# Patient Record
Sex: Female | Born: 1970 | Race: Black or African American | Hispanic: No | Marital: Married | Smoking: Never smoker
Health system: Southern US, Community
[De-identification: ages and names within clinical notes are randomized; demographics above are authoritative.]

## PROBLEM LIST (undated history)

## (undated) DIAGNOSIS — E669 Obesity, unspecified: Secondary | ICD-10-CM

## (undated) DIAGNOSIS — D259 Leiomyoma of uterus, unspecified: Secondary | ICD-10-CM

## (undated) DIAGNOSIS — R51 Headache: Secondary | ICD-10-CM

## (undated) DIAGNOSIS — R519 Headache, unspecified: Secondary | ICD-10-CM

## (undated) HISTORY — DX: Leiomyoma of uterus, unspecified: D25.9

## (undated) HISTORY — DX: Headache, unspecified: R51.9

## (undated) HISTORY — PX: WISDOM TOOTH EXTRACTION: SHX21

## (undated) HISTORY — DX: Obesity, unspecified: E66.9

## (undated) HISTORY — DX: Headache: R51

---

## 1997-07-02 ENCOUNTER — Other Ambulatory Visit: Admission: RE | Admit: 1997-07-02 | Discharge: 1997-07-02 | Payer: Self-pay | Admitting: Obstetrics and Gynecology

## 1998-10-20 ENCOUNTER — Inpatient Hospital Stay (HOSPITAL_COMMUNITY): Admission: AD | Admit: 1998-10-20 | Discharge: 1998-10-22 | Payer: Self-pay | Admitting: Obstetrics and Gynecology

## 1998-10-24 ENCOUNTER — Encounter (HOSPITAL_COMMUNITY): Admission: RE | Admit: 1998-10-24 | Discharge: 1999-01-22 | Payer: Self-pay | Admitting: Obstetrics and Gynecology

## 1998-11-19 ENCOUNTER — Other Ambulatory Visit: Admission: RE | Admit: 1998-11-19 | Discharge: 1998-11-19 | Payer: Self-pay | Admitting: Obstetrics and Gynecology

## 2000-01-02 ENCOUNTER — Other Ambulatory Visit: Admission: RE | Admit: 2000-01-02 | Discharge: 2000-01-02 | Payer: Self-pay | Admitting: Obstetrics and Gynecology

## 2001-01-31 ENCOUNTER — Other Ambulatory Visit: Admission: RE | Admit: 2001-01-31 | Discharge: 2001-01-31 | Payer: Self-pay | Admitting: Obstetrics and Gynecology

## 2002-05-07 ENCOUNTER — Observation Stay (HOSPITAL_COMMUNITY): Admission: AD | Admit: 2002-05-07 | Discharge: 2002-05-07 | Payer: Self-pay | Admitting: Obstetrics and Gynecology

## 2002-05-07 ENCOUNTER — Encounter: Payer: Self-pay | Admitting: Obstetrics and Gynecology

## 2002-05-12 ENCOUNTER — Encounter (HOSPITAL_COMMUNITY): Admission: RE | Admit: 2002-05-12 | Discharge: 2002-06-11 | Payer: Self-pay | Admitting: Obstetrics and Gynecology

## 2002-05-15 ENCOUNTER — Encounter: Payer: Self-pay | Admitting: Obstetrics and Gynecology

## 2002-05-22 ENCOUNTER — Encounter: Payer: Self-pay | Admitting: Obstetrics and Gynecology

## 2002-05-29 ENCOUNTER — Encounter: Payer: Self-pay | Admitting: Obstetrics and Gynecology

## 2002-06-05 ENCOUNTER — Encounter: Payer: Self-pay | Admitting: Obstetrics and Gynecology

## 2002-06-12 ENCOUNTER — Ambulatory Visit (HOSPITAL_COMMUNITY): Admission: RE | Admit: 2002-06-12 | Discharge: 2002-06-12 | Payer: Self-pay | Admitting: Obstetrics and Gynecology

## 2002-06-12 ENCOUNTER — Encounter: Payer: Self-pay | Admitting: Obstetrics and Gynecology

## 2002-06-16 ENCOUNTER — Encounter: Admission: RE | Admit: 2002-06-16 | Discharge: 2002-06-16 | Payer: Self-pay | Admitting: Obstetrics and Gynecology

## 2002-06-19 ENCOUNTER — Encounter: Admission: RE | Admit: 2002-06-19 | Discharge: 2002-06-19 | Payer: Self-pay | Admitting: Obstetrics and Gynecology

## 2002-06-19 ENCOUNTER — Ambulatory Visit (HOSPITAL_COMMUNITY): Admission: RE | Admit: 2002-06-19 | Discharge: 2002-06-19 | Payer: Self-pay | Admitting: Obstetrics and Gynecology

## 2002-06-19 ENCOUNTER — Encounter: Payer: Self-pay | Admitting: Obstetrics and Gynecology

## 2002-06-21 ENCOUNTER — Inpatient Hospital Stay (HOSPITAL_COMMUNITY): Admission: AD | Admit: 2002-06-21 | Discharge: 2002-06-26 | Payer: Self-pay | Admitting: Obstetrics and Gynecology

## 2002-06-21 ENCOUNTER — Encounter (INDEPENDENT_AMBULATORY_CARE_PROVIDER_SITE_OTHER): Payer: Self-pay | Admitting: Specialist

## 2002-09-18 ENCOUNTER — Encounter: Admission: RE | Admit: 2002-09-18 | Discharge: 2002-10-18 | Payer: Self-pay | Admitting: Obstetrics and Gynecology

## 2002-12-16 ENCOUNTER — Other Ambulatory Visit: Admission: RE | Admit: 2002-12-16 | Discharge: 2002-12-16 | Payer: Self-pay | Admitting: Obstetrics and Gynecology

## 2003-12-18 ENCOUNTER — Ambulatory Visit: Payer: Self-pay | Admitting: Family Medicine

## 2004-02-11 ENCOUNTER — Other Ambulatory Visit: Admission: RE | Admit: 2004-02-11 | Discharge: 2004-02-11 | Payer: Self-pay | Admitting: Obstetrics and Gynecology

## 2005-05-22 ENCOUNTER — Ambulatory Visit: Payer: Self-pay | Admitting: Family Medicine

## 2005-07-21 ENCOUNTER — Ambulatory Visit: Payer: Self-pay | Admitting: Family Medicine

## 2006-07-18 ENCOUNTER — Ambulatory Visit: Payer: Self-pay | Admitting: Family Medicine

## 2006-08-08 ENCOUNTER — Telehealth: Payer: Self-pay | Admitting: Family Medicine

## 2006-09-07 ENCOUNTER — Ambulatory Visit: Payer: Self-pay

## 2006-12-06 ENCOUNTER — Ambulatory Visit: Payer: Self-pay | Admitting: Internal Medicine

## 2006-12-06 DIAGNOSIS — E782 Mixed hyperlipidemia: Secondary | ICD-10-CM

## 2006-12-06 DIAGNOSIS — R002 Palpitations: Secondary | ICD-10-CM | POA: Insufficient documentation

## 2006-12-10 ENCOUNTER — Telehealth (INDEPENDENT_AMBULATORY_CARE_PROVIDER_SITE_OTHER): Payer: Self-pay | Admitting: *Deleted

## 2006-12-11 ENCOUNTER — Encounter: Payer: Self-pay | Admitting: Internal Medicine

## 2007-01-22 ENCOUNTER — Ambulatory Visit: Payer: Self-pay | Admitting: Cardiology

## 2007-02-04 ENCOUNTER — Ambulatory Visit: Payer: Self-pay

## 2007-02-04 ENCOUNTER — Encounter: Payer: Self-pay | Admitting: Cardiology

## 2007-02-04 ENCOUNTER — Ambulatory Visit: Payer: Self-pay | Admitting: Cardiology

## 2007-10-23 ENCOUNTER — Ambulatory Visit: Payer: Self-pay | Admitting: Internal Medicine

## 2007-10-23 DIAGNOSIS — K449 Diaphragmatic hernia without obstruction or gangrene: Secondary | ICD-10-CM | POA: Insufficient documentation

## 2007-11-06 ENCOUNTER — Telehealth (INDEPENDENT_AMBULATORY_CARE_PROVIDER_SITE_OTHER): Payer: Self-pay | Admitting: *Deleted

## 2008-10-28 ENCOUNTER — Telehealth (INDEPENDENT_AMBULATORY_CARE_PROVIDER_SITE_OTHER): Payer: Self-pay | Admitting: *Deleted

## 2008-11-03 ENCOUNTER — Ambulatory Visit: Payer: Self-pay | Admitting: Internal Medicine

## 2008-11-03 LAB — CONVERTED CEMR LAB
Albumin: 3.8 g/dL (ref 3.5–5.2)
BUN: 10 mg/dL (ref 6–23)
Basophils Relative: 0.7 % (ref 0.0–3.0)
Bilirubin, Direct: 0 mg/dL (ref 0.0–0.3)
Chloride: 104 meq/L (ref 96–112)
Cholesterol: 173 mg/dL (ref 0–200)
Eosinophils Relative: 0.8 % (ref 0.0–5.0)
Glucose, Bld: 86 mg/dL (ref 70–99)
Hemoglobin: 13 g/dL (ref 12.0–15.0)
LDL Cholesterol: 123 mg/dL — ABNORMAL HIGH (ref 0–99)
Lymphocytes Relative: 27.2 % (ref 12.0–46.0)
Monocytes Relative: 4.8 % (ref 3.0–12.0)
Neutro Abs: 3.1 10*3/uL (ref 1.4–7.7)
Potassium: 4.1 meq/L (ref 3.5–5.1)
RBC: 4.39 M/uL (ref 3.87–5.11)
Total Protein: 7.6 g/dL (ref 6.0–8.3)
Triglycerides: 39 mg/dL (ref 0.0–149.0)
VLDL: 7.8 mg/dL (ref 0.0–40.0)

## 2008-11-12 ENCOUNTER — Ambulatory Visit: Payer: Self-pay | Admitting: Internal Medicine

## 2009-06-03 ENCOUNTER — Ambulatory Visit: Payer: Self-pay | Admitting: Internal Medicine

## 2009-06-03 ENCOUNTER — Telehealth (INDEPENDENT_AMBULATORY_CARE_PROVIDER_SITE_OTHER): Payer: Self-pay | Admitting: *Deleted

## 2009-06-03 DIAGNOSIS — Z8679 Personal history of other diseases of the circulatory system: Secondary | ICD-10-CM | POA: Insufficient documentation

## 2009-06-03 DIAGNOSIS — R030 Elevated blood-pressure reading, without diagnosis of hypertension: Secondary | ICD-10-CM

## 2009-06-03 DIAGNOSIS — R51 Headache: Secondary | ICD-10-CM

## 2009-06-03 DIAGNOSIS — R519 Headache, unspecified: Secondary | ICD-10-CM | POA: Insufficient documentation

## 2010-03-13 LAB — CONVERTED CEMR LAB
ALT: 14 units/L (ref 0–40)
AST: 16 units/L (ref 0–37)
Alkaline Phosphatase: 86 units/L (ref 39–117)
BUN: 11 mg/dL (ref 6–23)
Basophils Relative: 0.2 % (ref 0.0–1.0)
CO2: 29 meq/L (ref 19–32)
Chloride: 106 meq/L (ref 96–112)
Creatinine, Ser: 0.9 mg/dL (ref 0.4–1.2)
Eosinophils Absolute: 0.1 10*3/uL (ref 0.0–0.7)
Eosinophils Relative: 1 % (ref 0–5)
HCT: 38.9 % (ref 36.0–46.0)
HCT: 39.8 % (ref 36.0–46.0)
HDL: 39.9 mg/dL (ref >39.0)
Hemoglobin: 13.4 g/dL (ref 12.0–15.0)
Hgb A1c MFr Bld: 5.2 % (ref 4.6–6.0)
LDL Cholesterol: 147 mg/dL — ABNORMAL HIGH (ref 0–99)
Lymphs Abs: 1.7 10*3/uL (ref 0.7–4.0)
MCV: 91.1 fL (ref 78.0–100.0)
Magnesium: 2 mg/dL (ref 1.5–2.5)
Monocytes Absolute: 0.3 10*3/uL (ref 0.2–0.7)
Monocytes Relative: 5.3 % (ref 3.0–11.0)
Platelets: 237 10*3/uL (ref 150–400)
Potassium: 4.1 meq/L (ref 3.5–5.1)
RDW: 12 % (ref 11.5–14.6)
RDW: 12.8 % (ref 11.5–15.5)
TSH: 0.84 microintl units/mL (ref 0.35–5.50)
Total Bilirubin: 0.8 mg/dL (ref 0.3–1.2)
Total Protein: 7.1 g/dL (ref 6.0–8.3)
VLDL: 8 mg/dL (ref 0–40)
WBC: 5.4 10*3/uL (ref 4.0–10.5)

## 2010-03-15 NOTE — Progress Notes (Signed)
Summary: elevated BP  Phone Note Call from Patient   Caller: Patient Summary of Call: pt left VM that BP was elevated to at 141/83. pt states that while exercising today her heart began to race so she stopped exercising. pt states that she is unsure if BP reading is normal or what it is suppose to be. pt would like a call back to discuss BP reading. Tried to call pt wireless customer unavailable...............Marland KitchenFelecia Deloach CMA  June 03, 2009 11:40 AM   Follow-up for Phone Call        pt c/o sob, and heart racing while exercising.  pt currently being wean off weight loss med PHENTERMINE. pt denies any chest pain, numbness, tingling, dizziness. pt coming in for OV today..............Marland KitchenFelecia Deloach CMA  June 03, 2009 11:53 AM

## 2010-03-15 NOTE — Assessment & Plan Note (Signed)
Summary: elevated BP//fd   Vital Signs:  Patient profile:   40 year old female Weight:      236.4 pounds Temp:     99.1 degrees F oral Pulse rate:   72 / minute Resp:     15 per minute BP sitting:   138 / 88  (left arm) Cuff size:   large  Vitals Entered By: Shonna Chock (June 03, 2009 2:42 PM) CC: Elevated B/P , Hypertension Management Comments REVIEWED MED LIST, PATIENT AGREED DOSE AND INSTRUCTION CORRECT    Primary Care Provider:  Laury Axon  CC:  Elevated B/P  and Hypertension Management.  History of Present Illness: Today she had tachycardia after 2 minutes on elliptical & then 5 min later after  1 min on treadmill.She had been exercising for 3 months w/o rate changes.Her BP was 141/83; it has been "normal" in past. She is on Phentermine every other day from Dr Dareen Piano. She also has  had increased caffeine intake  this week.  Hypertension History:      She complains of headache and palpitations, but denies chest pain, dyspnea with exertion, orthopnea, PND, peripheral edema, visual symptoms, neurologic problems, and syncope.        Positive major cardiovascular risk factors include hyperlipidemia and family history for ischemic heart disease (males less than 30 years old).  Negative major cardiovascular risk factors include female age less than 68 years old and non-tobacco-user status.     Allergies: 1)  ! Pcn  Review of Systems Eyes:  Denies blurring, double vision, and vision loss-both eyes. CV:  Complains of leg cramps with exertion; denies lightheadness and near fainting; Cramps with walking but not with CVE. Neuro:  Complains of headaches; denies disturbances in coordination, numbness, poor balance, and tingling; "Couple " headaches over crown, ? throbbing, lasting 1 hr, better with sinus medication. No PMH of migraine. trigger= "wind blowing". Allergy:  Denies itching eyes and sneezing.  Physical Exam  General:  well-nourished,in no acute distress; alert,appropriate  and cooperative throughout examination Neck:  No deformities, masses, or tenderness noted. Lungs:  Normal respiratory effort, chest expands symmetrically. Lungs are clear to auscultation, no crackles or wheezes. Heart:  Normal rate and regular rhythm. S1 and S2 normal without gallop, murmur, click, rub . S4 Abdomen:  Bowel sounds positive,abdomen soft and non-tender without masses, organomegaly or hernias noted. No AAA Pulses:  R and L carotid,radial,dorsalis pedis and posterior tibial pulses are full and equal bilaterally Extremities:  No clubbing, cyanosis, edema. Neg Homan's Neurologic:  alert & oriented X3 and DTRs symmetrical and normal.   Skin:  Intact without suspicious lesions or rashes. Striae  Psych:  memory intact for recent and remote, normally interactive, and good eye contact.     Impression & Recommendations:  Problem # 1:  ELEVATED BLOOD PRESSURE WITHOUT DIAGNOSIS OF HYPERTENSION (ICD-796.2)  Orders: EKG w/ Interpretation (93000) Echo- Stress (Stress Echo)  Problem # 2:  HEADACHE (ICD-784.0)  Orders: EKG w/ Interpretation (93000)  Problem # 3:  TACHYCARDIA, HX OF (ICD-V12.50)  Complete Medication List: 1)  Phentermine Hcl 37.5 Mg Caps (Phentermine hcl) .Marland Kitchen.. 1 by mouth every other day  Hypertension Assessment/Plan:      The patient's hypertensive risk group is category B: At least one risk factor (excluding diabetes) with no target organ damage.  Her calculated 10 year risk of coronary heart disease is 1 %.  Today's blood pressure is 138/88.    Patient Instructions: 1)  Check your Blood Pressure regularly. If it  is above:135/85 ON AVERAGE  you should make an appointment. No CVE until Stress ECHO is completed. Discuss risks & benefits of  continuing  Phentermine with Dr Dareen Piano.

## 2010-04-20 ENCOUNTER — Other Ambulatory Visit: Payer: Self-pay | Admitting: Obstetrics and Gynecology

## 2010-06-28 NOTE — Procedures (Signed)
[facility]                              EXERCISE TREADMILL   Helen Miles, Helen Miles                  MRN:          [id number]  DATE:02/04/2007                            DOB:          03-01-1970    PRIMARY CARE PHYSICIAN:  Titus Dubin. Alwyn Ren, MD.   PROCEDURE:  Exercise treadmill test.   INDICATION:  Evaluate patient with chest discomfort and exercise-induced  tachycardia.   PROCEDURE NOTE:  The patient was exercised using the standard Bruce  protocol.  She was exercised for 8 minutes.  This completed 2 minutes in  stage 3.  The test was terminated because she achieved the target heart  rate and because of fatigue.  She achieved 10.1 mets.  Her maximum heart  rate was 171, which was 92% of predicted.  She had an appropriate though  somewhat blunted blood pressure response.  The maximum was 123/87.  She  had premature ventricular contractions.  These were during exertion and  actually went away with peak exercise.  There were no ischemic ST-T wave  changes.  She had a normal heart rate recovery.  She had no chest pain.  She had appropriate shortness of breath.   CONCLUSION:  Negative adequate exercise treadmill test.   PLAN:  Based on the above, the patient has no high risk features for  obstructive coronary disease.  She had a moderate exercise tolerance.  I  gave her a prescription for exercise.  I did appreciate the premature  ventricular contractions, which have been bothering her, but do not  suggest any further cardiovascular testing.     Rollene Rotunda, MD, [facility]  Electronically Signed    JH/MedQ  DD: 02/04/2007  DT: 02/04/2007  Job #: [id number]   cc:   Titus Dubin. Alwyn Ren, MD,FACP,FCCP

## 2010-06-28 NOTE — Assessment & Plan Note (Signed)
[facility]                            CARDIOLOGY OFFICE NOTE   NOHA, MILBERGER                  MRN:          [id number]  DATE:01/22/2007                            DOB:          01-Nov-1970    REFERRING PHYSICIAN:  Titus Dubin. Alwyn Ren, MD,FACP,FCCP   REASON FOR CONSULTATION:  Evaluate patient's palpitations.   HISTORY OF PRESENT ILLNESS:  The patient has a very pleasant, 40-year-  old African-American female with palpitations for the past 1-1/2 years.  She develops these sporadically.  She mostly notices them when she is  quiet or lying down at night.  When she lays down she feels her heart  going fast.  She does not describe it as irregular.  She did notice  recently, that she was able to bring on a very rapid heart rate with  minimal exertion on a treadmill; this worried her.  She did wear an  event monitor for one month, but apparently had no palpitations during  that time, and it did not capture any dysrhythmia.  She has had no other  cardiac workup, other than labs, which are pending for TSH and  magnesium.  She has never had any presyncope or syncope.  She denies any  chest discomfort, neck, or arm discomfort.  She has no shortness of  breath, PND, or orthopnea.   PAST MEDICAL HISTORY:  She has no history of hypertension, diabetes, or  hyperlipidemia.   PAST SURGICAL HISTORY:  C-section.   ALLERGIES:  PENICILLIN.   CURRENT MEDICATIONS:  None.   SOCIAL HISTORY:  The patient is married.  She has four young children.  She is a Psychologist, prison and probation services.  She has never smoked cigarettes and does  not drink alcohol.   FAMILY HISTORY:  Contributory for her father having a myocardial  infarction at age 61.  There is no history of sudden death, syncope,  heart failure.   REVIEW OF SYSTEMS:  Positive for occasional headaches.  Negative for  other systems.   PHYSICAL EXAMINATION:  The patient is well-appearing, and in no  distress.  Blood  pressure 114/80, heart rate 66 and regular, weight 264 pounds,  body mass index 48.  HEENT:  Eyes unremarkable, pupils equal, round and react to light.  Fundi not visualized.  Oral mucosa, unremarkable.  NECK:  No jugular venous distention at 45 degrees.  Carotid upstrokes  brisk and symmetric.  No bruits, no thyromegaly.  LYMPHATIC:  No cervical, axillary, or inguinal adenopathy.  LUNGS:  Clear to auscultation bilaterally.  BACK:  No cva tenderness.  CHEST:  Unremarkable.  HEART:  PMI displaced and sustained.  S1 and S2 within normal limits.  No S3, no S4, no clicks, rubs, or murmurs.  ABDOMEN:  Obese, positive bowel sounds, normal frequency, pitch.  No  bruits, no rebound, no guarding in the midline.pulsatileetile  tenderness,  no splenomegaly.  SKIN:  No rashes, no nodules.  EXTREMITIES:  2+ pulses throughout.  No edema, no cyanosis, no clubbing.  NEUROLOGIC:  Oriented to person, place, and time.  Cranial nerves II-XII  grossly intact.  Motor grossly intact.   EKG sinus  rhythm, rate 66, axis within normal limits, early transition  in lead V2, no acute ST-T wave changes.   ASSESSMENT AND PLAN:  1. Palpitations.  The patient has palpitations, that sound more like a      sustained tachyarrhythmia rather than isolated ectopic beats.  A 1      month, the event monitor was not helpful in capturing these, so I      will not reapply this.  Labs are pending.  She did have the recent      onset of symptoms while walking on a treadmill, so I will order an      POET (plain old exercise treadmill).  At the same time, when she      has this, I will order an echocardiogram to make sure that she has      a structurally normal heart.  Further evaluation based on future      symptoms and these studies.  2. Morbid obesity.  The patient has morbid obesity and understands the      importance to lose weight.  I have discussed with her the [employer].   FOLLOWUP:  I will see her back at  the time of her stress test.     Rollene Rotunda, MD, [facility]  Electronically Signed    JH/MedQ  DD: 01/22/2007  DT: 01/22/2007  Job #: [id number]   cc:   Titus Dubin. Alwyn Ren, MD,FACP,FCCP

## 2010-07-01 NOTE — Op Note (Signed)
NAMESHANEE, Helen Miles                     ACCOUNT NO.:  [id number]   MEDICAL RECORD NO.:  [id number]                   PATIENT TYPE:  INP   LOCATION:  9142                                 FACILITY:  [facility]   PHYSICIAN:  Malva Limes, M.D.                 DATE OF BIRTH:  Feb 25, 1970   DATE OF PROCEDURE:  06/21/2002  DATE OF DISCHARGE:                                 OPERATIVE REPORT   PREOPERATIVE DIAGNOSES:  1. Intrauterine twin gestation at 36-1/2 weeks estimated gestational age.  2. Active labor.  3. History of prior cesarean section with attempt at vaginal birth after     cesarean section.  4. Intrauterine growth retardation in twin A.   POSTOPERATIVE DIAGNOSES:  1. Intrauterine twin gestation at 36-1/2 weeks estimated gestational age.  2. Active labor.  3. History of prior cesarean section with attempt at vaginal birth after     cesarean section.  4. Intrauterine growth retardation in twin A.   PROCEDURE:  Repeat low transverse cesarean section.   SURGEON:  Malva Limes, M.D.   ANESTHESIA:  Spinal.   ANTIBIOTICS:  Ancef 1 gm.   DRAINS:  Foley catheter bedside drainage.   ESTIMATED BLOOD LOSS:  900 cc.   SPECIMENS:  Placentas sent to pathology.   COMPLICATIONS:  None.   FINDINGS:  The patient had a normal appearing uterus. Fallopian tubes and  ovaries were normal bilaterally. The patient delivered twin A in the vertex  presentation which was a viable black female infant. Twin B was delivered in  the breech presentation, but also was a viable black female.   DESCRIPTION OF PROCEDURE:  The patient was taken to the operating room where  spinal anesthesia was administered without complication. She was then placed  in the dorsal lithotomy position with a left lateral tilt. She was prepped  with Hibiclens and draped in the usual fashion for this procedure. A Foley  catheter had previously been placed.   A Pfannenstiel incision was made through the previous scar. This  was carried  down to the fascia. The fascia was entered in the midline and extended  laterally with the Mayo scissors. The rectus muscles were then dissected  from the fascia with the bovie. The rectus muscles were divided in the  midline and taken superiorly and inferiorly.   The parietal peritoneum was entered bluntly. The bladder flap was taken down  sharply. A low transverse uterine incision was made in the midline and  extended  laterally with dissection. The amniotic sac was entered sharply  slightly stained meconium fluid was noted in twin A's sac. Twin A was  delivered in the vertex presentation. The cord was doubly clamped and cut  and the infant handed to the waiting Neonatal Intensive Care Unit team.   Twin B's sac was ruptured and the fluid was found to be clear. The infant  was delivered in the breech presentation. On delivery of  the head the  oropharynx and nostrils were bulb suctioned. The cord was doubly clamped and  cut and handed to the waiting NICU team.   Cord blood was then taken from both cords. The placenta was then manually  removed. The uterine cavity was wiped with a wet lap. The uterine incision  was closed in a single layer with 0 chromics in a running locking fashion.  The bladder flap was not closed. Hemostasis was checked and felt to be  adequate.   The fallopian tubes and ovaries were then examined. The parietal peritoneum  and rectus muscles were reapproximated in the midline using 3-0 chromic in a  running fashion. The fascia was closed using 0 Monocryl suture in a running  fashion. The subcuticular tissue was made hemostatic with a bovie. Stainless  steel clips were used for the skin.   The patient was taken to the recovery room in stable condition. Instrument  and lap counts were correct x2.                                               Malva Limes, M.D.    MA/MEDQ  D:  06/21/2002  T:  06/23/2002  Job:  [id number]

## 2010-07-01 NOTE — Discharge Summary (Signed)
[name], Miles                     ACCOUNT NO.:  [id number]   MEDICAL RECORD NO.:  [id number]                   PATIENT TYPE:  INP   LOCATION:  9142                                 FACILITY:  [facility]   PHYSICIAN:  Malva Limes, M.D.                 DATE OF BIRTH:  February 04, 1971   DATE OF ADMISSION:  06/21/2002  DATE OF DISCHARGE:  06/26/2002                                 DISCHARGE SUMMARY   FINAL DIAGNOSES:  1. Intrauterine twin gestation at 36-1/[redacted] weeks gestation, active labor.  2. History of prior cesarean section with attempt at vaginal birth after     cesarean section and intrauterine growth retardation on Twin A.   PROCEDURE:  Repeat low transverse cesarean section.   SURGEON:  Malva Limes, MD   COMPLICATIONS:  None.   HOSPITAL COURSE:  This 40 year old, G6, P2, 0-3-2 presents at 36-1/[redacted] weeks  gestation in active labor.  The patient does have a twin gestation and twin  A does have some growth retardation.  The patient has been followed with  nonstress tests which have not been reactive and Doppler ultrasound.  The  patient was offered VBAC but declined.  Upon admission, the patient's cervix  was 4 cm dilated.  The patient's antepartum course at this point had been  complicated, of course, by her twin gestation, a history of hypertension,  her history of cesarean section and her intrauterine growth retardation of  twin A which had been followed throughout the pregnancy.  The patient was  taken to the operating room on Jun 21, 2002 by Dr. Malva Limes where repeat  low transverse cesarean section was performed with the delivery of twin A  weighing 4 pounds 8 ounces with Apgar's of 9 and 9, a little boy, and twin B  was also a little boy weighing 4 pounds 15 ounces with Apgar's of 6 and 9.  Delivery went without complication.  Twin A did go to the NICU for some  hypoglycemia.  The patient's postoperative course was complicated by some  mild increase in her  temperatures, questionable endometritis.  The patient  was started on Clindamycin and gentamicin at this time.  She was felt ready  for discharge on postoperative day five.  She had been afebrile for 24  hours.  Her abdomen was soft and the incision was healing well.  She was  sent home on a regular diet, told to decrease activity, told to continue  prenatal vitamins, was given a prescription for Percocet 1 to 2 every four  hours as needed for pain.  She was told she could use ibuprofen every six  hours.  She is to continue checking her temperatures.  She is to call with  any increased temperature or any increased pain.  She is to follow up in the  office in four weeks.   LABS ON DISCHARGE:  The patient had a hemoglobin of 11.8, white blood  cell  count of 10.1.     Leilani Able, P.A.-C.                Malva Limes, M.D.    MB/MEDQ  D:  07/28/2002  T:  07/28/2002  Job:  [id number]

## 2010-09-23 ENCOUNTER — Ambulatory Visit (INDEPENDENT_AMBULATORY_CARE_PROVIDER_SITE_OTHER): Payer: Managed Care, Other (non HMO) | Admitting: Internal Medicine

## 2010-09-23 ENCOUNTER — Encounter: Payer: Self-pay | Admitting: Internal Medicine

## 2010-09-23 DIAGNOSIS — E782 Mixed hyperlipidemia: Secondary | ICD-10-CM

## 2010-09-23 DIAGNOSIS — R0789 Other chest pain: Secondary | ICD-10-CM

## 2010-09-23 DIAGNOSIS — Z0000 Encounter for general adult medical examination without abnormal findings: Secondary | ICD-10-CM

## 2010-09-23 NOTE — Patient Instructions (Addendum)
Preventive Health Care: Exercise  30-45  minutes a day, 3-4 days a week ONLY AFTER cardiac status assessed. Walking is especially valuable in preventing Osteoporosis. Eat a low-fat diet with lots of fruits and vegetables, up to 7-9 servings per day. Avoid obesity; your goal = waist less than 35 inches.Eat a low-fat diet with lots of fruits and vegetables, up to 7-9 servings per day. Consume less than  30 grams of sugar per day from foods & drinks with High Fructose Corn Sugar as #1,2,3 or # 4 on label. Follow the low carb nutrition program in [employer] as closely as possible to prevent Diabetes . White carbohydrates (potatoes, rice, bread, and pasta) have a high spike of sugar and a high load of sugar. For example a  baked potato has a cup of sugar and a  french fry  2 teaspoons of sugar. Yams, wild  rice, whole grained bread &  wheat pasta have been much lower spike and load of  sugar. Portions should be the size of a deck of cards or your palm.  Please keep a diary of your headaches . Document  each occurrence on the calendar with notation of : #1 any prodrome ( any non headache symptom such as marked fatigue,visual changes, ,etc ) which precedes actual headache ; #2) severity on 1-10 scale; #3) any triggers ( food/ drink,enviromenntal or weather changes ,physical or emotional stress) in 8-12 hour period prior to the headache; & #4) response to any medications or other intervention. Please review "Headache" @ [facility] for additional information. Call if you do need medication Health Care Power of Attorney & Living Will place you in charge of your health care  decisions. Verify these are  in place. Please  schedule fasting Labs : BMET,Lipids, hepatic panel, CBC & dif, TSH.

## 2010-09-23 NOTE — Progress Notes (Signed)
Subjective:    Patient ID: Helen Miles, female    DOB: 1970-08-17, 40 y.o.   MRN: [id number]  HPI  She  is here for a physical;acute issues are delineated below.      Review of Systems  #1 HEADACHE : Onset: intermittently since 5/12 but worse past 2 weeks   Location: L crown  Quality: throbbing Frequency: every other day  Precipitating factors: environmental changes (A/C)  Prior treatment: Tylenol Sinus , warm compresses Associated Symptoms Nausea/vomiting: no  Photophobia/phonophobia: yes, light sensitivity if headache not treated  Tearing of eyes: no  Sinus pain/pressure: yes, frontal & maxillary bilaterally  Family hx migraine: no  Personal stressors: no  Relation to menstrual cycle: no  Red Flags Fever: no  Neck pain/stiffness: no  Vision/speech/swallow/hearing difficulty: no  Focal weakness/numbness: no  Altered mental status: no  Trauma: no  New type of headache: no  Anticoagulant use: no   #2 FATIGUE Onset: 3 weeks   Fatigue @ rest : yes    Primarily physical fatigue: yes Symptoms: Night sweats: no                                                                                            Vision changes ( blurred/ double/ loss): no                                                                                                  Hoarseness or swallowing dysfunction: no                                                                                          Bowel changes( constipation/ diarrhea): no                                                                                      Weight change: yes, down 5 #   Exertional chest pain: no, but single episode of rest chest pain 8/9. She is engaged in cardiovascular exercise 4-5 times a week for least 45 minutes. She uses an elliptical machine.  She denies any exertional chest pain with that program Dyspnea on exertion: no  Cough: no New Meds: weight loss agent  From Dr Dareen Piano, Gyn,D/Ced 3 weeks ago  Leg  swelling: no  PND: no  Melena/ rectal bleeding: no  Adenopathy: single L cervical LN, negative ENT evaluation 2 months ago  Severe snoring: no  Daytime sleepiness: no  Skin / hair / nail changes: nails dark & puffy @ bases Temperature intolerance( heat/ cold) : cold intolerance because of headaches                                                                                                   Feeling depressed: no  Altered appetite: no  Poor sleep/ Apnea : no  Abnormal bruising / bleeding : no                                                                        PMH/ FH of thyroid disease: PGGM had thyroid disease         Objective:   Physical Exam Gen.: Healthy and well-nourished in appearance. Alert, appropriate and cooperative throughout exam. Head: Normocephalic without obvious abnormalities Eyes: No corneal or conjunctival inflammation noted. Pupils equal round reactive to light and accommodation. Fundal exam is benign without hemorrhages, exudate, papilledema. Extraocular motion intact. Vision grossly normal. Ears: External  ear exam reveals no significant lesions or deformities. Canals clear .TMs normal. Hearing is grossly normal bilaterally. Nose: External nasal exam reveals no deformity or inflammation. Nasal mucosa are pink and moist. No lesions or exudates noted. Septum not deviated Mouth: Oral mucosa and oropharynx reveal no lesions or exudates. Teeth in good repair. Neck: No deformities, masses, or tenderness noted. Range of motion &. Thyroid normal. Lungs: Normal respiratory effort; chest expands symmetrically. Lungs are clear to auscultation without rales, wheezes, or increased work of breathing. Heart: Normal rate and rhythm. Normal S1 and S2. No gallop, click, or rub. Grade1/6 systolic murmur heard best supine. Abdomen: Bowel sounds normal; abdomen soft and nontender. No masses, organomegaly or hernias noted. Striae Genitalia: Dr Dareen Piano, [facility]   .                                                                                    Musculoskeletal/extremities: No deformity or scoliosis noted of  the thoracic or lumbar spine. No clubbing, cyanosis, edema, or deformity noted. Range of motion  normal .Tone & strength  normal.Joints normal. Nail health  Good; clubbing not suggested Vascular: Carotid, radial artery, dorsalis  pedis and  posterior tibial pulses are full and equal. No bruits present. Neurologic: Alert and oriented x3. Deep tendon reflexes symmetrical and normal.Cranial nerve exam normal.  Gait , Romberg & finger to nose normal.       Skin: Intact without suspicious lesions or rashes. Lymph: No cervical, axillary, or inguinal lymphadenopathy present. Psych: Mood and affect are normal. Normally interactive                                                                                         Assessment & Plan:  #1 comprehensive physical exam; no acute findings #2 see Problem List with Assessments & Recommendations  #3 headaches, left-sided, recent exacerbation. Trigger appears to be environmental , ie  temperature change. Neurologic exam reveals no deficits  #4 fatigue  #5 atypical rest chest pain  #6 dyslipidemia with family history of premature coronary disease (father).  Plan: See orders and recommendations. Note: EKG reveals no ischemic changes. PMH of negative stress test.

## 2010-09-30 ENCOUNTER — Ambulatory Visit: Payer: Managed Care, Other (non HMO)

## 2010-09-30 DIAGNOSIS — Z0000 Encounter for general adult medical examination without abnormal findings: Secondary | ICD-10-CM

## 2010-09-30 LAB — LIPID PANEL
LDL Cholesterol: 124 mg/dL — ABNORMAL HIGH (ref 0–99)
Total CHOL/HDL Ratio: 3
Triglycerides: 31 mg/dL (ref 0.0–149.0)

## 2010-09-30 LAB — URINALYSIS
Bilirubin Urine: NEGATIVE
Hgb urine dipstick: NEGATIVE
Total Protein, Urine: NEGATIVE
pH: 6 (ref 5.0–8.0)

## 2010-09-30 LAB — HEPATIC FUNCTION PANEL
AST: 17 U/L (ref 0–37)
Albumin: 3.8 g/dL (ref 3.5–5.2)
Alkaline Phosphatase: 74 U/L (ref 39–117)
Bilirubin, Direct: 0.1 mg/dL (ref 0.0–0.3)
Total Bilirubin: 0.8 mg/dL (ref 0.3–1.2)

## 2010-09-30 LAB — BASIC METABOLIC PANEL
BUN: 17 mg/dL (ref 6–23)
CO2: 25 mEq/L (ref 19–32)
Calcium: 9.1 mg/dL (ref 8.4–10.5)
Chloride: 106 mEq/L (ref 96–112)
Creatinine, Ser: 0.8 mg/dL (ref 0.4–1.2)
Glucose, Bld: 80 mg/dL (ref 70–99)

## 2010-09-30 LAB — CBC WITH DIFFERENTIAL/PLATELET
Eosinophils Relative: 0.9 % (ref 0.0–5.0)
HCT: 37.2 % (ref 36.0–46.0)
Hemoglobin: 12.4 g/dL (ref 12.0–15.0)
Lymphs Abs: 1.1 10*3/uL (ref 0.7–4.0)
MCV: 89.3 fl (ref 78.0–100.0)
Monocytes Absolute: 0.3 10*3/uL (ref 0.1–1.0)
Monocytes Relative: 7.1 % (ref 3.0–12.0)
Neutro Abs: 2.6 10*3/uL (ref 1.4–7.7)
Platelets: 173 10*3/uL (ref 150.0–400.0)
RDW: 13 % (ref 11.5–14.6)
WBC: 4.1 10*3/uL — ABNORMAL LOW (ref 4.5–10.5)

## 2010-11-07 ENCOUNTER — Other Ambulatory Visit: Payer: Self-pay | Admitting: Obstetrics and Gynecology

## 2010-12-05 ENCOUNTER — Telehealth: Payer: Self-pay | Admitting: Internal Medicine

## 2010-12-05 NOTE — Telephone Encounter (Signed)
Left message to call office

## 2010-12-05 NOTE — Telephone Encounter (Signed)
Use Aleve one to 2 every 8 hours with food as needed for pain.Use warm moist compresses to 3 times a day to the affected area. To ER if pain persists or is associaled with chest pain or shortness of breath

## 2010-12-05 NOTE — Telephone Encounter (Signed)
Pt denies any warmness, swelling or redness in thigh. Pt indicated that she has been working out and increased workout to 7 days  Vs the 5 to 6 days she had been doing previously. Pt noticed pain last night that ran up her thigh and again today.. Pt notes that she has been in car for 1.5 hours which she does daily for work. Pt denies any other symptoms. Pt schedule for OV on Wednesday only time Pt able to come in. Pt advise if symptoms increase or change she needs to be seen in ED, Pt ok.

## 2010-12-05 NOTE — Telephone Encounter (Signed)
Patient is having pain left leg - thigh - she had sharp pains yesterday & it started again today she wanted to know if she needs an appt

## 2010-12-07 ENCOUNTER — Encounter: Payer: Self-pay | Admitting: Internal Medicine

## 2010-12-07 ENCOUNTER — Ambulatory Visit (INDEPENDENT_AMBULATORY_CARE_PROVIDER_SITE_OTHER): Payer: Managed Care, Other (non HMO) | Admitting: Internal Medicine

## 2010-12-07 VITALS — BP 126/84 | HR 64 | Temp 98.7°F | Wt 238.8 lb

## 2010-12-07 DIAGNOSIS — M79652 Pain in left thigh: Secondary | ICD-10-CM

## 2010-12-07 DIAGNOSIS — M79609 Pain in unspecified limb: Secondary | ICD-10-CM

## 2010-12-07 NOTE — Telephone Encounter (Signed)
Pt seen in office today.

## 2010-12-07 NOTE — Progress Notes (Signed)
  Subjective:    Patient ID: Helen Miles, female    DOB: Mar 06, 1970, 40 y.o.   MRN: [id number]  HPI Extremity pain Location:L thigh Onset:10/21 @ approx 10:30 pm in evening; she had completed usual gym work out that day Context : after sitting @ computer several hrs Trigger/injury:no Pain quality:sharp Pain severity:up to 10 Duration:5 seconds , recurred 3 X that night & 2-3 X on Mon 10/22. No recurrence since Radiation:moved inches upwards Exacerbating factors:no Treatment/response:none Review of systems: Constitutional: no fever, chills, sweats, change in weight  Musculoskeletal:no  muscle cramps or pain; no  joint stiffness, redness, or swelling Skin:no rash, color change Neuro: no weakness; incontinence (stool/urine); numbness and tingling Heme:no lymphadenopathy; abnormal bruising or bleeding     There is no personal history of clotting disorders. Remotely such is present in her paternal grandfather's brothers. She does not smoke and is not on hormone replacement.    Review of Systems  She has had itching diffusely since taking fluconazole for yeast infection from her gynecologist. 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 We discussed her obtaining a medic alert bracelet     Objective:   Physical Exam  She appears healthy and well-nourished and in no acute distress.  Nares are patent with no airway obstruction. Oropharynx is normal with no oral pharyngeal limitation.  Chest is clear with no increased work of breathing, wheezing or rhonchi.  She has no lymphadenopathy about the head, neck, or axilla.  Her abdomen is nontender with no masses.  Joints and nail health are excellent. Tongue and strength are good. There is a minor lateral deviation of the third right PIP joint.  There is minor crepitus of the left knee.  She is able to lie flat and sit up without help. There is negative straight leg raising to 90. No asymmetry of the spine is  noted.  Homans sign is negative bilaterally.  Gait including heel and toe walking is normal  Visualized scan reveals no significant lesions. No significant dermatographia  noted on direct testing.        Assessment & Plan:  #1 self-limited radicular pain in the left thigh possibly L3. This is most likely related to sitting at a computer. Ergonomics should be pursued if symptoms recur. No neuro deficits noted.  Muscle skeletal exam essentially normal  #2 subjective pruritis; no evidence of scratch induced urticaria.  #3 PMH  angioedema with penicillin  Plan: See recommendations

## 2010-12-07 NOTE — Patient Instructions (Signed)
Again please consider wearing a medic alert  bracelet concerning penicillin. Use Allegra daily as needed for itching. Report warning signs as discussed

## 2010-12-10 ENCOUNTER — Emergency Department (HOSPITAL_COMMUNITY): Payer: Managed Care, Other (non HMO)

## 2010-12-10 ENCOUNTER — Emergency Department (HOSPITAL_COMMUNITY)
Admission: EM | Admit: 2010-12-10 | Discharge: 2010-12-10 | Disposition: A | Discharge status: Home / Self Care | Payer: Managed Care, Other (non HMO) | Attending: Emergency Medicine | Admitting: Emergency Medicine

## 2010-12-10 ENCOUNTER — Inpatient Hospital Stay (INDEPENDENT_AMBULATORY_CARE_PROVIDER_SITE_OTHER)
Admission: RE | Admit: 2010-12-10 | Discharge: 2010-12-10 | Disposition: A | Discharge status: Home / Self Care | Payer: Managed Care, Other (non HMO) | Source: Ambulatory Visit | Attending: Family Medicine | Admitting: Family Medicine

## 2010-12-10 DIAGNOSIS — R0602 Shortness of breath: Secondary | ICD-10-CM | POA: Insufficient documentation

## 2010-12-10 DIAGNOSIS — R0989 Other specified symptoms and signs involving the circulatory and respiratory systems: Secondary | ICD-10-CM | POA: Insufficient documentation

## 2010-12-10 DIAGNOSIS — R079 Chest pain, unspecified: Secondary | ICD-10-CM

## 2010-12-10 DIAGNOSIS — R109 Unspecified abdominal pain: Secondary | ICD-10-CM

## 2010-12-10 DIAGNOSIS — R0609 Other forms of dyspnea: Secondary | ICD-10-CM | POA: Insufficient documentation

## 2010-12-10 DIAGNOSIS — M79609 Pain in unspecified limb: Secondary | ICD-10-CM | POA: Insufficient documentation

## 2010-12-10 LAB — DIFFERENTIAL
Eosinophils Relative: 2 % (ref 0–5)
Lymphocytes Relative: 40 % (ref 12–46)
Monocytes Absolute: 0.3 10*3/uL (ref 0.1–1.0)
Monocytes Relative: 6 % (ref 3–12)
Neutro Abs: 2.6 10*3/uL (ref 1.7–7.7)

## 2010-12-10 LAB — POCT URINALYSIS DIP (DEVICE)
Bilirubin Urine: NEGATIVE
Glucose, UA: NEGATIVE mg/dL
Specific Gravity, Urine: 1.01 (ref 1.005–1.030)
pH: 7 (ref 5.0–8.0)

## 2010-12-10 LAB — CBC
HCT: 38.1 % (ref 36.0–46.0)
Hemoglobin: 12.8 g/dL (ref 12.0–15.0)
MCH: 29.6 pg (ref 26.0–34.0)
MCHC: 33.6 g/dL (ref 30.0–36.0)
MCV: 88.2 fL (ref 78.0–100.0)

## 2010-12-10 LAB — BASIC METABOLIC PANEL
BUN: 11 mg/dL (ref 6–23)
CO2: 27 mEq/L (ref 19–32)
Chloride: 105 mEq/L (ref 96–112)
GFR calc Af Amer: 82 mL/min — ABNORMAL LOW (ref >90)
Glucose, Bld: 82 mg/dL (ref 70–99)
Potassium: 3.9 mEq/L (ref 3.5–5.1)

## 2010-12-10 MED ORDER — IOHEXOL 350 MG/ML SOLN
100.0000 mL | Freq: Once | INTRAVENOUS | Status: AC | PRN
  Administered 2010-12-10: 100 mL via INTRAVENOUS

## 2010-12-11 ENCOUNTER — Ambulatory Visit (HOSPITAL_COMMUNITY)
Admission: RE | Admit: 2010-12-11 | Payer: Managed Care, Other (non HMO) | Source: Ambulatory Visit | Admitting: Emergency Medicine

## 2010-12-11 ENCOUNTER — Ambulatory Visit (HOSPITAL_COMMUNITY)
Admission: RE | Admit: 2010-12-11 | Discharge: 2010-12-11 | Disposition: A | Discharge status: Home / Self Care | Payer: Managed Care, Other (non HMO) | Source: Ambulatory Visit | Attending: Emergency Medicine | Admitting: Emergency Medicine

## 2010-12-11 DIAGNOSIS — M79609 Pain in unspecified limb: Secondary | ICD-10-CM | POA: Insufficient documentation

## 2010-12-11 DIAGNOSIS — M7989 Other specified soft tissue disorders: Secondary | ICD-10-CM | POA: Insufficient documentation

## 2010-12-23 ENCOUNTER — Encounter (HOSPITAL_COMMUNITY): Payer: Self-pay | Admitting: Emergency Medicine

## 2010-12-23 ENCOUNTER — Emergency Department (HOSPITAL_COMMUNITY): Payer: No Typology Code available for payment source

## 2010-12-23 ENCOUNTER — Emergency Department (HOSPITAL_COMMUNITY)
Admission: EM | Admit: 2010-12-23 | Discharge: 2010-12-23 | Disposition: A | Discharge status: Home / Self Care | Payer: No Typology Code available for payment source | Attending: Emergency Medicine | Admitting: Emergency Medicine

## 2010-12-23 DIAGNOSIS — S161XXA Strain of muscle, fascia and tendon at neck level, initial encounter: Secondary | ICD-10-CM

## 2010-12-23 DIAGNOSIS — M542 Cervicalgia: Secondary | ICD-10-CM | POA: Insufficient documentation

## 2010-12-23 DIAGNOSIS — E785 Hyperlipidemia, unspecified: Secondary | ICD-10-CM | POA: Insufficient documentation

## 2010-12-23 DIAGNOSIS — S139XXA Sprain of joints and ligaments of unspecified parts of neck, initial encounter: Secondary | ICD-10-CM | POA: Insufficient documentation

## 2010-12-23 DIAGNOSIS — R0602 Shortness of breath: Secondary | ICD-10-CM | POA: Insufficient documentation
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
## 2010-12-30 ENCOUNTER — Ambulatory Visit (INDEPENDENT_AMBULATORY_CARE_PROVIDER_SITE_OTHER): Payer: No Typology Code available for payment source | Admitting: Internal Medicine

## 2010-12-30 ENCOUNTER — Encounter: Payer: Self-pay | Admitting: Internal Medicine

## 2010-12-30 DIAGNOSIS — M542 Cervicalgia: Secondary | ICD-10-CM

## 2010-12-30 DIAGNOSIS — M546 Pain in thoracic spine: Secondary | ICD-10-CM

## 2010-12-30 DIAGNOSIS — M549 Dorsalgia, unspecified: Secondary | ICD-10-CM

## 2010-12-30 MED ORDER — CYCLOBENZAPRINE HCL 5 MG PO TABS: ORAL_TABLET | ORAL | Status: DC

## 2010-12-30 MED ORDER — TRAMADOL HCL 50 MG PO TABS: 50.0000 mg | ORAL_TABLET | Freq: Four times a day (QID) | ORAL | Status: AC | PRN
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
## 2010-12-30 NOTE — Patient Instructions (Signed)
Please call 11/19 if significant symptoms persist; referral will be made to physical therapy or chiropractry

## 2011-06-02 ENCOUNTER — Encounter: Payer: Self-pay | Admitting: Internal Medicine

## 2011-06-02 ENCOUNTER — Ambulatory Visit (INDEPENDENT_AMBULATORY_CARE_PROVIDER_SITE_OTHER): Payer: Managed Care, Other (non HMO) | Admitting: Internal Medicine

## 2011-06-02 VITALS — BP 118/76 | HR 64 | Temp 98.7°F | Wt 247.4 lb

## 2011-06-02 DIAGNOSIS — R599 Enlarged lymph nodes, unspecified: Secondary | ICD-10-CM

## 2011-06-02 DIAGNOSIS — IMO0002 Reserved for concepts with insufficient information to code with codable children: Secondary | ICD-10-CM

## 2011-06-02 DIAGNOSIS — M792 Neuralgia and neuritis, unspecified: Secondary | ICD-10-CM

## 2011-06-02 DIAGNOSIS — R590 Localized enlarged lymph nodes: Secondary | ICD-10-CM

## 2011-06-02 NOTE — Progress Notes (Signed)
  Subjective:    Patient ID: Helen Miles, female    DOB: 1971/02/12, 41 y.o.   MRN: [id number]  HPI  She is concerned because of posterior cervical lymphadenopathy on the left. This has been present for 15 years but she is concerned that it may be enlarging. It may have  increased from possibly 2 mm to 5 mm.  She denies any associated signs of upper respiratory tract infection. Specifically she denies frontal headaches, facial pain, nasal purulence, sore throat, otic  pain or discharge. She denies any poor dental health.  She was seen by ENT who told her this was of no concern.    Review of Systems on average 3 times a week she will notice a warm sensation radiating from the left lateral neck up to the temple lasting 30 sec or less w/o trigger.  She denies headaches, extremity numbness or tingling, weakness, or incontinence of urine or stool.     Objective:   Physical Exam General appearance:good health ;well nourished; no acute distress or increased work of breathing is present.  No  lymphadenopathy in  axilla . It is pea size lymph node in the left inferior neck area  Eyes: No conjunctival inflammation or lid edema is present. There is no scleral icterus.  Ears:  External ear exam shows no significant lesions or deformities.  Otoscopic examination reveals clear canals, tympanic membranes are intact bilaterally without bulging, retraction, inflammation or discharge.  Nose:  External nasal examination shows no deformity or inflammation. Nasal mucosa are pink and moist without lesions or exudates. No septal dislocation or deviation.No obstruction to airflow.   Oral exam: Dental hygiene is good; lips and gums are healthy appearing.There is no oropharyngeal erythema or exudate noted.   Neck:  No deformities, thyromegaly, masses, or tenderness noted.   Supple with full range of motion without pain.   Abdomen: No organomegaly or masses  Neurology: Deep tendon reflexes are normal.  Strength and tone are normal   Extremities:  No cyanosis, edema, or clubbing  noted    Skin: Warm & dry w/o jaundice or tenting.          Assessment & Plan:  #1 cervical lymphadenopathy; this is small and clinically not pathologic. This has been present 15 years. She feels it  is enlarging and is of great concern to her.  #2 neuralgia type symptoms the left neck, probably unrelated. These are too brief & intermittent  at this time to warrant any intervention. Further evaluation indicated if this progresses. Neurologic exam reveals no deficit  Plan: The only intervention I can offer her is to have her evaluated by general surgeon to have this lymph node removed. This could be associated with increased risk of anesthesia reaction, infection, bleeding complications, keloid , etc.

## 2011-06-02 NOTE — Patient Instructions (Signed)
Please keep a diary of the symptoms and look for any triggers such as position or activity possibly causing it. It sounds like a neuralgia.

## 2011-10-30 ENCOUNTER — Other Ambulatory Visit: Payer: Self-pay | Admitting: Otolaryngology

## 2011-10-30 ENCOUNTER — Ambulatory Visit
Admission: RE | Admit: 2011-10-30 | Discharge: 2011-10-30 | Disposition: A | Discharge status: Home / Self Care | Payer: Managed Care, Other (non HMO) | Source: Ambulatory Visit | Attending: Otolaryngology | Admitting: Otolaryngology

## 2011-10-30 DIAGNOSIS — M542 Cervicalgia: Secondary | ICD-10-CM

## 2011-11-06 ENCOUNTER — Telehealth: Payer: Self-pay | Admitting: Internal Medicine

## 2011-11-06 DIAGNOSIS — Z0000 Encounter for general adult medical examination without abnormal findings: Secondary | ICD-10-CM

## 2011-11-06 DIAGNOSIS — E785 Hyperlipidemia, unspecified: Secondary | ICD-10-CM

## 2011-11-06 NOTE — Telephone Encounter (Signed)
Pt state when she was in for her last appt she was told she could go to a walk in lab in [place] by the nurse but pt can not remember what the name of it is. Pt needs Biometric screening prior to CPE for insurance discount. Cb# [phone]

## 2011-11-07 NOTE — Telephone Encounter (Signed)
Spoke with patient, patient aware she can walk-in to [facility] office (future orders placed). Patient will send in form, form will be completed once lab results back

## 2011-11-15 NOTE — Telephone Encounter (Signed)
Pt states she needs LDL, fasting blood glucose and A1c for her biometrics testing. She states she was told to call back and let Dr. Frederik Pear assistant know what she needed. Pt call back # [phone]

## 2011-11-15 NOTE — Addendum Note (Signed)
Addended by: Maurice Small on: 11/15/2011 01:35 PM   Modules accepted: Orders

## 2011-11-15 NOTE — Telephone Encounter (Signed)
Orders updated to reflect patient's request

## 2011-12-06 ENCOUNTER — Other Ambulatory Visit (INDEPENDENT_AMBULATORY_CARE_PROVIDER_SITE_OTHER): Payer: Managed Care, Other (non HMO)

## 2011-12-06 DIAGNOSIS — E785 Hyperlipidemia, unspecified: Secondary | ICD-10-CM

## 2011-12-06 DIAGNOSIS — Z0000 Encounter for general adult medical examination without abnormal findings: Secondary | ICD-10-CM

## 2011-12-06 LAB — CBC WITH DIFFERENTIAL/PLATELET
Basophils Relative: 0.4 % (ref 0.0–3.0)
Eosinophils Relative: 0.8 % (ref 0.0–5.0)
MCV: 91.8 fl (ref 78.0–100.0)
Monocytes Absolute: 0.2 10*3/uL (ref 0.1–1.0)
Neutrophils Relative %: 67.9 % (ref 43.0–77.0)
RBC: 4.22 Mil/uL (ref 3.87–5.11)
WBC: 4.2 10*3/uL — ABNORMAL LOW (ref 4.5–10.5)

## 2011-12-06 LAB — HEPATIC FUNCTION PANEL
ALT: 14 U/L (ref 0–35)
Total Bilirubin: 0.6 mg/dL (ref 0.3–1.2)

## 2011-12-06 LAB — LIPID PANEL
HDL: 48.3 mg/dL (ref >39.00)
LDL Cholesterol: 112 mg/dL — ABNORMAL HIGH (ref 0–99)
Total CHOL/HDL Ratio: 3
VLDL: 4.6 mg/dL (ref 0.0–40.0)

## 2011-12-06 LAB — BASIC METABOLIC PANEL
CO2: 28 mEq/L (ref 19–32)
Glucose, Bld: 80 mg/dL (ref 70–99)
Potassium: 4.4 mEq/L (ref 3.5–5.1)
Sodium: 137 mEq/L (ref 135–145)

## 2012-01-05 ENCOUNTER — Ambulatory Visit (INDEPENDENT_AMBULATORY_CARE_PROVIDER_SITE_OTHER): Payer: Managed Care, Other (non HMO) | Admitting: Internal Medicine

## 2012-01-05 ENCOUNTER — Encounter: Payer: Self-pay | Admitting: Internal Medicine

## 2012-01-05 VITALS — BP 132/90 | HR 89 | Temp 99.4°F | Resp 12 | Ht 62.0 in | Wt 232.8 lb

## 2012-01-05 DIAGNOSIS — Z0000 Encounter for general adult medical examination without abnormal findings: Secondary | ICD-10-CM

## 2012-01-05 NOTE — Patient Instructions (Addendum)
Preventive Health Care: Eat a low-fat diet with lots of fruits and vegetables, up to 7-9 servings per day.  Consume less than 30 grams of sugar per day from foods & drinks with High Fructose Corn Syrup as #1,2,3 or #4 on label. Health Care Power of Attorney & Living Will place you in charge of your health care  decisions. Verify these are  in place. Psyllium hull extract is a natural option for constipation.  If you activate My Chart; the results can be released to you as soon as they populate from the lab. If you choose not to use this program; the labs have to be reviewed, copied & mailed causing a delay in getting the results to you.

## 2012-01-05 NOTE — Progress Notes (Signed)
  Subjective:    Patient ID: Helen Miles, female    DOB: 1970-07-04, 41 y.o.   MRN: [id number]  HPI  She  is here for a physical;acute issues include constipation      Review of Systems  She describes constipation for approximately 6 months; this has been partially responsive to Metamucil and fiber. She also has increased roughage in her diet Her TSH was excellent at 0.66 on 12/06/11.      Objective:   Physical Exam Gen.:  well-nourished in appearance. Alert, appropriate and cooperative throughout exam. Head: Normocephalic without obvious abnormalities Eyes: No corneal or conjunctival inflammation noted. Pupils equal round reactive to light and accommodation. Fundal exam is benign without hemorrhages, exudate, papilledema. Extraocular motion intact. Vision grossly normal. Ears: External  ear exam reveals no significant lesions or deformities. Canals clear .TMs normal. Hearing is grossly normal bilaterally. Nose: External nasal exam reveals no deformity or inflammation. Nasal mucosa are pink and moist. No lesions or exudates noted.   Mouth: Oral mucosa and oropharynx reveal no lesions or exudates. Teeth in good repair. Neck: No deformities, masses, or tenderness noted. Range of motion & Thyroid normal. Lungs: Normal respiratory effort; chest expands symmetrically. Lungs are clear to auscultation without rales, wheezes, or increased work of breathing. Heart: Normal rate and rhythm. Normal S1 and S2. No gallop, click, or rub. Grade 1/6 systolic murmur LSB when supine Abdomen: Bowel sounds normal; abdomen soft and nontender. No masses, organomegaly or hernias noted. Genitalia: Dr Dareen Piano, Gyn                                                         Musculoskeletal/extremities: No deformity or scoliosis noted of  the thoracic or lumbar spine. No clubbing, cyanosis, edema, or deformity noted. Range of motion  normal .Tone & strength  normal.Joints normal. Nail health  good. Vascular:  Carotid, radial artery, dorsalis pedis and  posterior tibial pulses are full and equal. No bruits present. Neurologic: Alert and oriented x3. Deep tendon reflexes symmetrical and normal.          Skin: Intact without suspicious lesions or rashes. Lymph: No cervical, axillary lymphadenopathy present. Psych: Mood and affect are normal. Normally interactive                                                                                        Assessment & Plan:  #1 comprehensive physical exam; no acute findings #2 constipation Plan: see Orders

## 2012-08-27 ENCOUNTER — Other Ambulatory Visit: Payer: Self-pay | Admitting: Obstetrics and Gynecology

## 2012-10-27 ENCOUNTER — Ambulatory Visit: Payer: Managed Care, Other (non HMO) | Admitting: Internal Medicine

## 2012-10-27 VITALS — BP 118/81 | HR 55 | Temp 99.2°F | Resp 18 | Ht 62.5 in | Wt 242.0 lb

## 2012-10-27 DIAGNOSIS — R05 Cough: Secondary | ICD-10-CM

## 2012-10-27 MED ORDER — HYDROCODONE-ACETAMINOPHEN 7.5-325 MG/15ML PO SOLN: 5.0000 mL | Freq: Four times a day (QID) | ORAL | Status: DC | PRN

## 2012-10-27 MED ORDER — AZITHROMYCIN 250 MG PO TABS: ORAL_TABLET | ORAL | Status: DC

## 2012-10-27 NOTE — Progress Notes (Signed)
  Subjective:    Patient ID: Helen Miles, female    DOB: 1970/03/22, 42 y.o.   MRN: [id number]  HPI PT started with a cough x 1 week ago. Her children have had it. Not really productive. Otherwise feels great. Did have chills one night. Cough has been keeping her awake at night. She has been using Robitussin, used Catering manager. + night sweats. 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  Review of Systems neg    Objective:   Physical Exam  Constitutional: She is oriented to person, place, and time. She appears well-developed and well-nourished.  HENT:  Right Ear: External ear normal.  Left Ear: External ear normal.  Nose: Nose normal.  Mouth/Throat: Oropharynx is clear and moist.  Eyes: Conjunctivae and EOM are normal. Pupils are equal, round, and reactive to light.  Neck: Normal range of motion. Neck supple.  Cardiovascular: Normal rate, regular rhythm and normal heart sounds.   Pulmonary/Chest: Effort normal and breath sounds normal. No respiratory distress. She has no wheezes. She has no rales. She exhibits no tenderness.  Musculoskeletal: Normal range of motion.  Lymphadenopathy:    She has no cervical adenopathy.  Neurological: She is alert and oriented to person, place, and time. She has normal reflexes. No cranial nerve deficit. She exhibits normal muscle tone. Coordination normal.  Psychiatric: She has a normal mood and affect. Her behavior is normal.          Assessment & Plan:  Zpak/Lortab elixir

## 2012-10-27 NOTE — Patient Instructions (Signed)

## 2012-10-27 NOTE — Progress Notes (Signed)
  Subjective:    Patient ID: Helen Miles, female    DOB: 21-Apr-1970, 42 y.o.   MRN: [id number]  HPI    Review of Systems     Objective:   Physical Exam        Assessment & Plan:

## 2013-01-13 IMAGING — CT CT ANGIO CHEST
2 of 6 series · 19 of 46 positions shown · IV contrast (APPLIED)
Comparison: None.

CLINICAL DATA: Left-sided chest pain.  Shortness of breath.  Left
lower extremity pain.

CT ANGIOGRAPHY CHEST WITH CONTRAST 12/10/2010:
TECHNIQUE: Multidetector CT imaging of the chest was performed
using the standard protocol during bolus administration of
intravenous contrast.  Multiplanar CT image reconstructions
including MIPs were obtained to evaluate the vascular anatomy.
Contrast: 100mL OMNIPAQUE IOHEXOL 350 MG/ML IV.

[Series 8: pulm embolism 1.0 b25f thin · axial · 0.62mm/px · z∈[-378,-180]mm · 16 of 217 slices shown]
[im 10/217  lung]
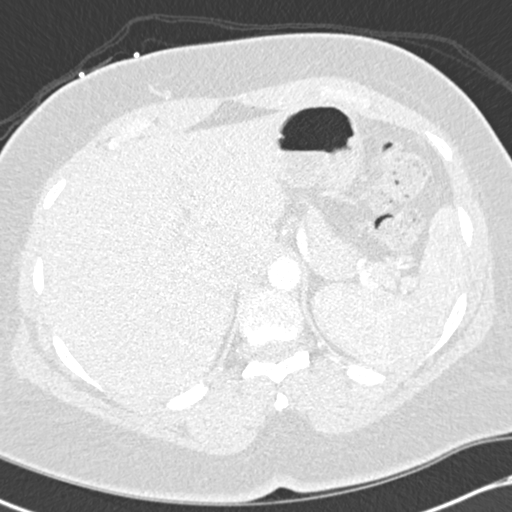
[im 29/217  soft-tissue]
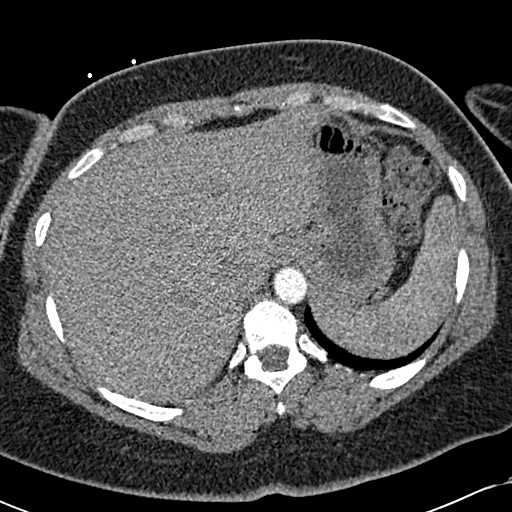
[im 38/217  lung]
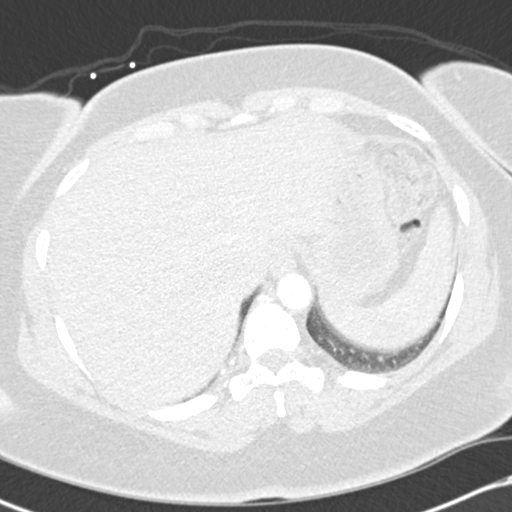
[im 47/217  soft-tissue]
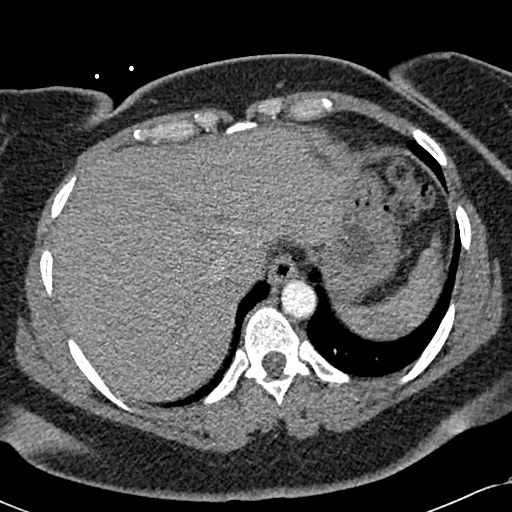
[im 66/217  lung]
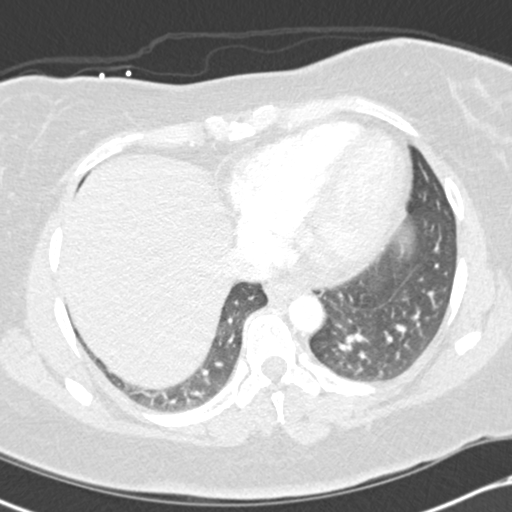
[im 76/217  soft-tissue]
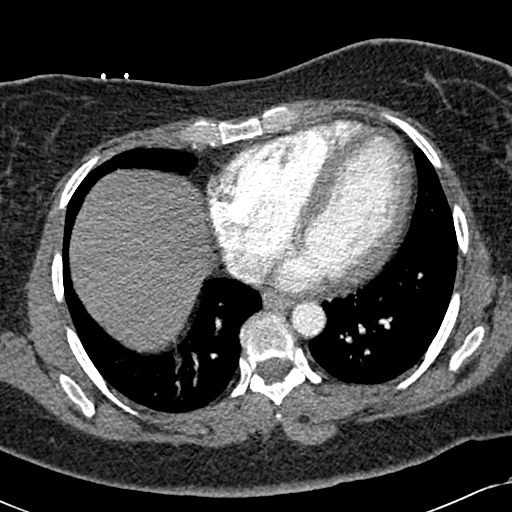
[im 85/217  lung]
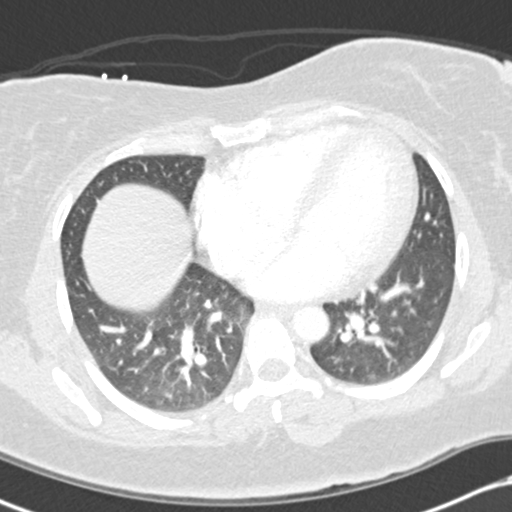
[im 104/217  soft-tissue]
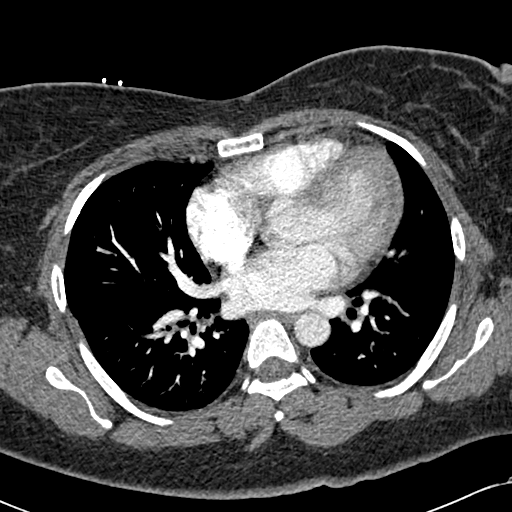
[im 113/217  lung]
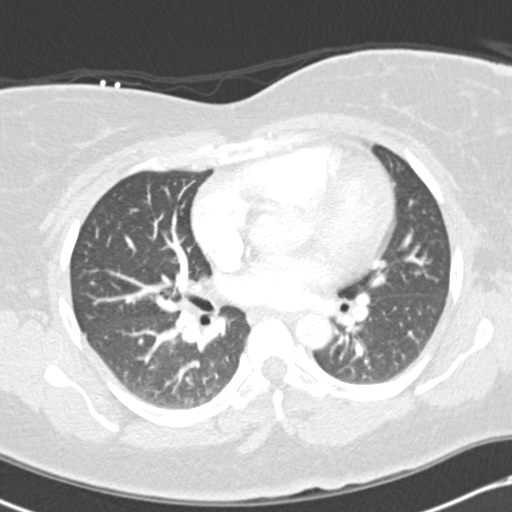
[im 132/217  soft-tissue]
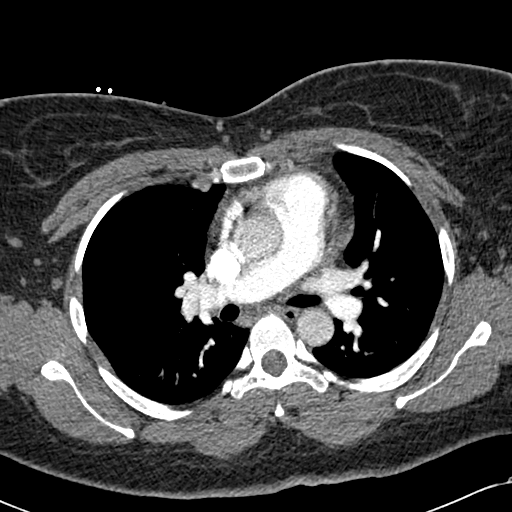
[im 141/217  lung]
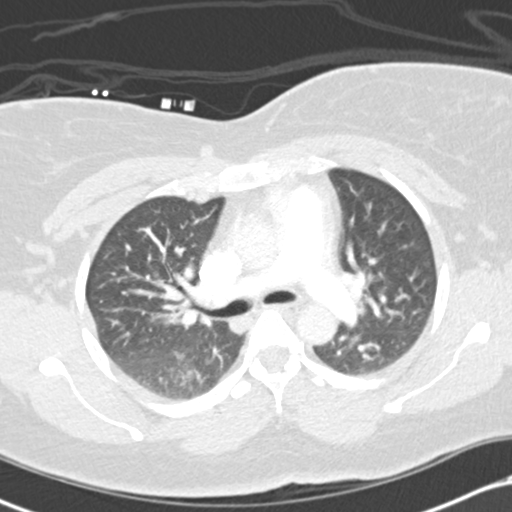
[im 151/217  soft-tissue]
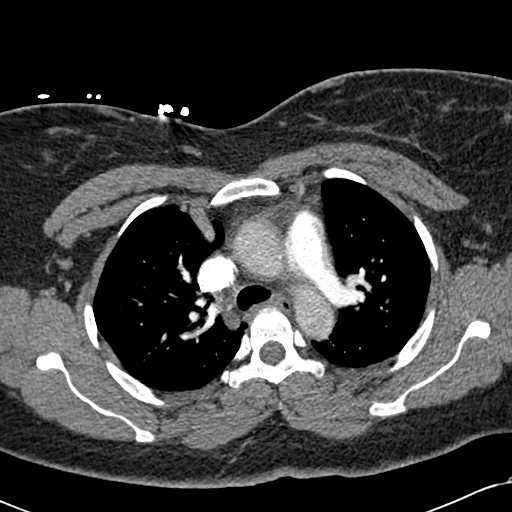
[im 170/217  lung]
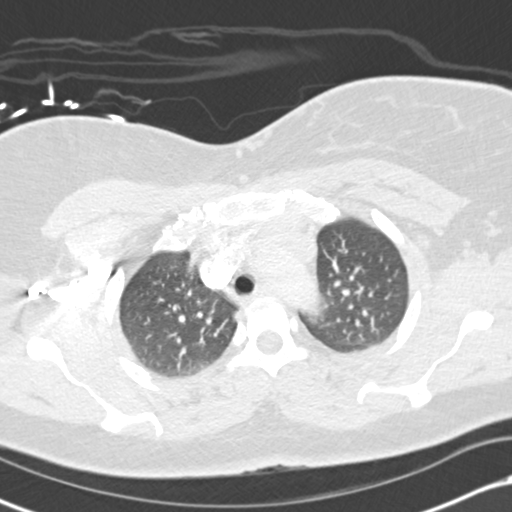
[im 179/217  soft-tissue]
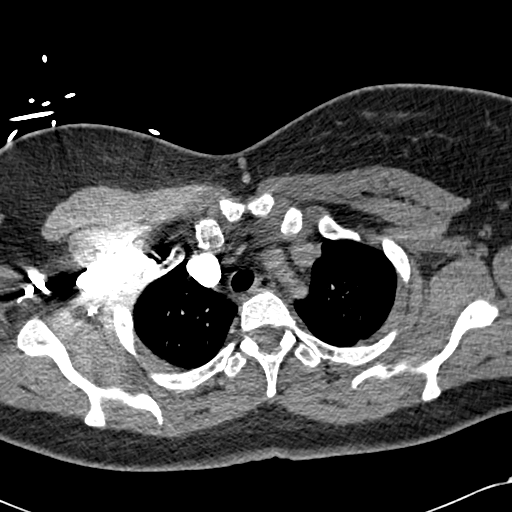
[im 188/217  lung]
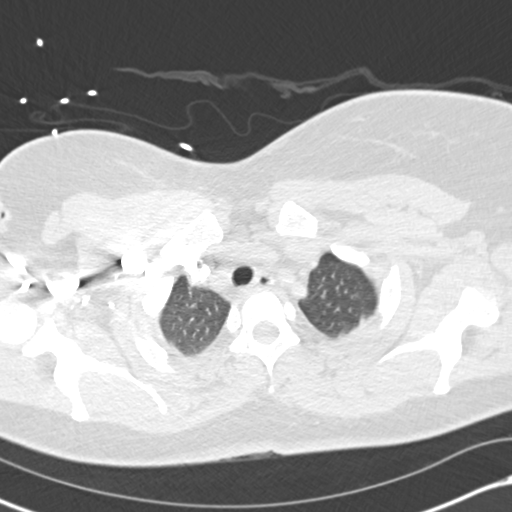
[im 207/217  soft-tissue]
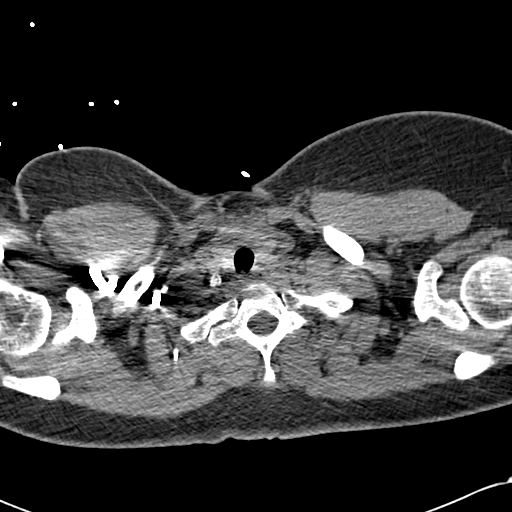

[Series 605: <mpr thick cor · coronal · 0.62mm/px · 3 of 97 slices shown]
[im 25/97  soft-tissue]
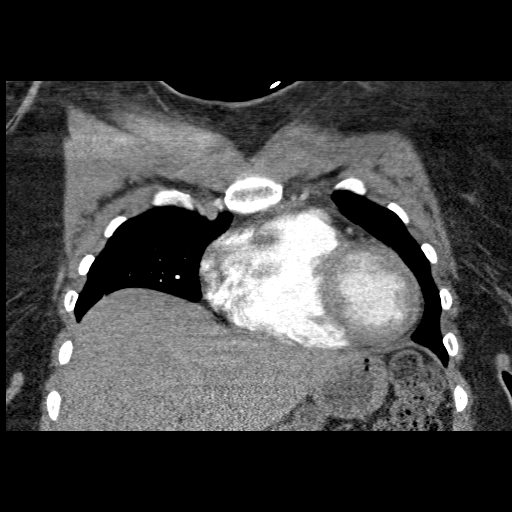
[im 49/97  soft-tissue]
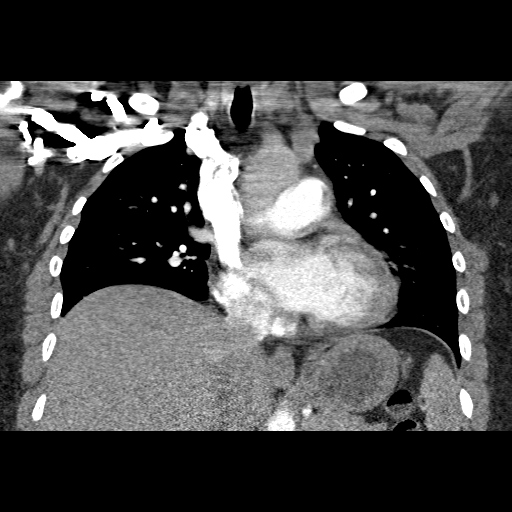
[im 73/97  soft-tissue]
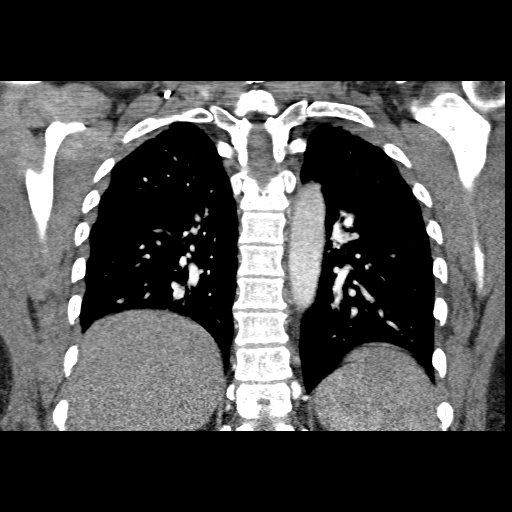

[19 of 46 positions shown; findings below may reference images not displayed]

FINDINGS: Contrast opacification of pulmonary arteries is very
good.  No filling defects within either main pulmonary artery or
their branches in either lung to suggest pulmonary embolism.  Heart
size upper normal.  No pericardial effusion.  No visible coronary
artery calcification.  No visible atherosclerosis involving the
thoracic or upper abdominal aorta or their visualized branches.

Low lung volumes with expected dependent atelectasis posteriorly in
the lower lobes.  Lungs otherwise clear without localized airspace
consolidation, interstitial disease, or parenchymal nodules/masses.
No pleural effusions.

No significant mediastinal, hilar, or axillary lymphadenopathy.
Visualized thyroid gland unremarkable.  Visualized upper abdomen
unremarkable for the early arterial phase of enhancement.  Bone
window images demonstrate mild mid and lower thoracic spondylosis.

Review of the MIP images confirms the above findings.
IMPRESSION: 1.  No evidence of pulmonary embolism.
2.  No acute cardiopulmonary disease.

## 2013-09-09 ENCOUNTER — Other Ambulatory Visit: Payer: Self-pay | Admitting: Obstetrics and Gynecology

## 2013-09-10 LAB — CYTOLOGY - PAP

## 2013-11-04 ENCOUNTER — Telehealth: Payer: Self-pay

## 2013-11-04 DIAGNOSIS — Z0000 Encounter for general adult medical examination without abnormal findings: Secondary | ICD-10-CM

## 2013-11-04 NOTE — Telephone Encounter (Signed)
She has not been seen or has lab done since 2013.  Please advise    KP

## 2013-11-04 NOTE — Telephone Encounter (Signed)
She is requesting to have labs done, is this appropriate or should she wait until she is seen or will you order labs.     KP

## 2013-11-04 NOTE — Telephone Encounter (Signed)
[name] [phone]  Helen Miles called she is a former patient of Dr Frederik Pear and she is wanting to change to Dr Laury Axon, I made a new patient appointment for her in December, but she needs her labs to be done sometime before the 20th of October for her job. Can we go ahead and schedule her for regular labs now and her come in December for new pt appointment.

## 2013-11-04 NOTE — Telephone Encounter (Signed)
V70.0  Lipid, hep, bmp, hep, ua, tsh

## 2013-11-04 NOTE — Telephone Encounter (Signed)
yes

## 2013-11-05 NOTE — Telephone Encounter (Signed)
Orders are in. Please schedule .       KP 

## 2013-11-05 NOTE — Telephone Encounter (Signed)
Schedule appointment for Friday 11-07-13

## 2013-11-07 ENCOUNTER — Other Ambulatory Visit (INDEPENDENT_AMBULATORY_CARE_PROVIDER_SITE_OTHER): Payer: Managed Care, Other (non HMO)

## 2013-11-07 DIAGNOSIS — R8290 Unspecified abnormal findings in urine: Secondary | ICD-10-CM

## 2013-11-07 DIAGNOSIS — Z0000 Encounter for general adult medical examination without abnormal findings: Secondary | ICD-10-CM

## 2013-11-07 LAB — BASIC METABOLIC PANEL
BUN: 15 mg/dL (ref 6–23)
CHLORIDE: 103 meq/L (ref 96–112)
CO2: 24 mEq/L (ref 19–32)
Calcium: 9.2 mg/dL (ref 8.4–10.5)
Creatinine, Ser: 1.1 mg/dL (ref 0.4–1.2)
GFR: 72.97 mL/min (ref >60.00)
Glucose, Bld: 84 mg/dL (ref 70–99)
POTASSIUM: 3.9 meq/L (ref 3.5–5.1)
Sodium: 133 mEq/L — ABNORMAL LOW (ref 135–145)

## 2013-11-07 LAB — POCT URINALYSIS DIPSTICK
Bilirubin, UA: NEGATIVE
GLUCOSE UA: NEGATIVE
Ketones, UA: NEGATIVE
LEUKOCYTES UA: NEGATIVE
NITRITE UA: NEGATIVE
Protein, UA: NEGATIVE
Spec Grav, UA: 1.025
Urobilinogen, UA: 0.2
pH, UA: 5.5

## 2013-11-07 LAB — LIPID PANEL
CHOLESTEROL: 181 mg/dL (ref 0–200)
HDL: 55.3 mg/dL (ref >39.00)
LDL Cholesterol: 116 mg/dL — ABNORMAL HIGH (ref 0–99)
NonHDL: 125.7
TRIGLYCERIDES: 50 mg/dL (ref 0.0–149.0)
Total CHOL/HDL Ratio: 3
VLDL: 10 mg/dL (ref 0.0–40.0)

## 2013-11-07 LAB — TSH: TSH: 0.37 u[IU]/mL (ref 0.35–4.50)

## 2013-11-07 LAB — HEPATIC FUNCTION PANEL
ALT: 16 U/L (ref 0–35)
AST: 18 U/L (ref 0–37)
Albumin: 4 g/dL (ref 3.5–5.2)
Alkaline Phosphatase: 68 U/L (ref 39–117)
BILIRUBIN DIRECT: 0 mg/dL (ref 0.0–0.3)
TOTAL PROTEIN: 7.4 g/dL (ref 6.0–8.3)
Total Bilirubin: 0.8 mg/dL (ref 0.2–1.2)

## 2013-11-08 LAB — URINE CULTURE: Colony Count: 40000

## 2014-01-15 ENCOUNTER — Telehealth: Payer: Self-pay | Admitting: *Deleted

## 2014-01-15 ENCOUNTER — Ambulatory Visit: Payer: Managed Care, Other (non HMO) | Admitting: Family Medicine

## 2014-01-15 NOTE — Telephone Encounter (Signed)
Pt did not show for appointment 01/15/2014 at 10:30am for "new pt tx from hopper"

## 2014-01-15 NOTE — Telephone Encounter (Signed)
Call pt --- since its hopps pt --- find out reason first--- thanks

## 2014-02-04 NOTE — Telephone Encounter (Signed)
Pt has new pt appointment scheduled with [place] 02/17/2014 at 11:30am

## 2014-02-17 ENCOUNTER — Ambulatory Visit (INDEPENDENT_AMBULATORY_CARE_PROVIDER_SITE_OTHER): Payer: Managed Care, Other (non HMO) | Admitting: Family Medicine

## 2014-02-17 ENCOUNTER — Other Ambulatory Visit: Payer: Self-pay | Admitting: Family Medicine

## 2014-02-17 ENCOUNTER — Encounter: Payer: Self-pay | Admitting: Family Medicine

## 2014-02-17 VITALS — BP 139/87 | HR 61 | Temp 97.8°F | Resp 16 | Ht 62.0 in | Wt 226.0 lb

## 2014-02-17 DIAGNOSIS — Z0000 Encounter for general adult medical examination without abnormal findings: Secondary | ICD-10-CM

## 2014-02-17 DIAGNOSIS — N6320 Unspecified lump in the left breast, unspecified quadrant: Secondary | ICD-10-CM

## 2014-02-17 DIAGNOSIS — R2232 Localized swelling, mass and lump, left upper limb: Secondary | ICD-10-CM

## 2014-02-17 LAB — THYROID PANEL WITH TSH
Free Thyroxine Index: 2.3 (ref 1.4–3.8)
T3 UPTAKE: 29 % (ref 22–35)
T4, Total: 7.8 ug/dL (ref 4.5–12.0)
TSH: 1.513 u[IU]/mL (ref 0.350–4.500)

## 2014-02-17 MED ORDER — NALTREXONE-BUPROPION HCL ER 8-90 MG PO TB12: ORAL_TABLET | ORAL | Status: DC

## 2014-02-17 NOTE — Progress Notes (Signed)
Subjective:     Helen Miles is a 44 y.o. female and is here for a comprehensive physical exam. The patient reports problems - struggling to lose weight.. Pt had been going to a weight loss clinic in [place] -- she was not losing weight and the Dr there recommended she get her thyroid checked. She is also c/o of a pea sized nodule in L axilla that is tender.  She is not sure how long it has been there.  History   Social History  . Marital Status: Married    Spouse Name: N/A    Number of Children: N/A  . Years of Education: N/A   Occupational History  . Not on file.   Social History Main Topics  . Smoking status: Never Smoker   . Smokeless tobacco: Not on file  . Alcohol Use: Yes     Comment: RARE, 1 glass wine/ month  . Drug Use: No  . Sexual Activity: Yes    Birth Control/ Protection: Surgical   Other Topics Concern  . Not on file   Social History Narrative   Health Maintenance  Topic Date Due  . INFLUENZA VACCINE  10/29/2014 (Originally 09/13/2013)  . PAP SMEAR  09/09/2016  . TETANUS/TDAP  12/05/2016    The following portions of the patient's history were reviewed and updated as appropriate:  She  has a past medical history of Hyperlipidemia. She  does not have any pertinent problems on file. She  has past surgical history that includes Cesarean section and Wisdom tooth extraction. Her family history includes Deep vein thrombosis in her maternal grandfather; Heart attack (age of onset: 8555) in her father; Hypertension in her maternal grandfather; Hypothyroidism in her other; Lung cancer in her maternal grandfather; Lupus in her brother and mother; Pulmonary embolism in her other. There is no history of Diabetes or Stroke. She  reports that she has never smoked. She does not have any smokeless tobacco history on file. She reports that she drinks alcohol. She reports that she does not use illicit drugs. She has a current medication list which includes the following  prescription(s): fiber, hydrocodone-acetaminophen, and phentermine. Current Outpatient Prescriptions on File Prior to Visit  Medication Sig Dispense Refill  . FIBER COMPLETE PO Take by mouth. 2 by mouth 2-3 x daily    . HYDROcodone-acetaminophen (HYCET) 7.5-325 mg/15 ml solution Take 5 mLs by mouth every 6 (six) hours as needed for pain (or cough). 240 mL 0  . phentermine (ADIPEX-P) 37.5 MG tablet Take 37.5 mg by mouth daily before breakfast.     No current facility-administered medications on file prior to visit.   She is allergic to penicillins..  Review of Systems  Review of Systems  Constitutional: Negative for activity change, appetite change and fatigue.  HENT: Negative for hearing loss, congestion, tinnitus and ear discharge.  dentist [id number] Eyes: Negative for visual disturbance (see optho q1y -- vision corrected to 20/20 with glasses).  Respiratory: Negative for cough, chest tightness and shortness of breath.   Cardiovascular: Negative for chest pain, palpitations and leg swelling.  Gastrointestinal: Negative for abdominal pain, diarrhea, constipation and abdominal distention.  Genitourinary: Negative for urgency, frequency, decreased urine volume and difficulty urinating.  Musculoskeletal: Negative for back pain, arthralgias and gait problem.  Skin: Negative for color change, pallor and rash.  Neurological: Negative for dizziness, light-headedness, numbness and headaches.  Hematological: Negative for adenopathy. Does not bruise/bleed easily.  Psychiatric/Behavioral: Negative for suicidal ideas, confusion, sleep disturbance, self-injury, dysphoric mood, decreased  concentration and agitation.      Objective:    BP 139/87 mmHg  Pulse 61  Temp(Src) 97.8 F (36.6 C) (Oral)  Resp 16  Ht  (1.575 m)  Wt 226 lb (102.513 kg)  BMI 41.33 kg/m2  SpO2 99% General appearance: alert, cooperative, appears stated age and no distress Head: Normocephalic, without obvious abnormality,  atraumatic Eyes: negative findings: lids and lashes normal, conjunctivae and sclerae normal and pupils equal, round, reactive to light and accomodation Ears: normal TM's and external ear canals both ears Nose: Nares normal. Septum midline. Mucosa normal. No drainage or sinus tenderness. Throat: lips, mucosa, and tongue normal; teeth and gums normal Neck: no adenopathy, no carotid bruit, no JVD, supple, symmetrical, trachea midline and thyroid not enlarged, symmetric, no tenderness/mass/nodules Back: symmetric, no curvature. ROM normal. No CVA tenderness. Lungs: clear to auscultation bilaterally Breasts: gyn--- L axilla --+ 1cm nodule palpated in L axilla,  Nontender, no drainage Heart: regular rate and rhythm, S1, S2 normal, no murmur, click, rub or gallop Abdomen: soft, non-tender; bowel sounds normal; no masses,  no organomegaly Pelvic: deferred-- gyn Extremities: extremities normal, atraumatic, no cyanosis or edema Pulses: 2+ and symmetric Skin: Skin color, texture, turgor normal. No rashes or lesions Lymph nodes: Cervical, supraclavicular, and axillary nodes normal. Neurologic: Alert and oriented X 3, normal strength and tone. Normal symmetric reflexes. Normal coordination and gait Psych-- no depression, no anxiety      Assessment:    Healthy female exam.      Plan:    ghm utd Check labs See After Visit Summary for Counseling Recommendations   1. Morbid obesity Start contrave and con't with diet and exercise rto 3 months or sooner prn - Naltrexone-Bupropion HCl ER (CONTRAVE) 8-90 MG TB12; 2 po bid  Dispense: 120 tablet; Refill: 2 - Thyroid Panel With TSH  2. Preventative health care    3. Axillary mass, left Diagnostic mammogram - [facility] Digital Diagnostic Bilat; Future

## 2014-02-17 NOTE — Patient Instructions (Signed)
Preventive Care for Adults A healthy lifestyle and preventive care can promote health and wellness. Preventive health guidelines for women include the following key practices.  A routine yearly physical is a good way to check with your health care provider about your health and preventive screening. It is a chance to share any concerns and updates on your health and to receive a thorough exam.  Visit your dentist for a routine exam and preventive care every 6 months. Brush your teeth twice a day and floss once a day. Good oral hygiene prevents tooth decay and gum disease.  The frequency of eye exams is based on your age, health, family medical history, use of contact lenses, and other factors. Follow your health care provider's recommendations for frequency of eye exams.  Eat a healthy diet. Foods like vegetables, fruits, whole grains, low-fat dairy products, and lean protein foods contain the nutrients you need without too many calories. Decrease your intake of foods high in solid fats, added sugars, and salt. Eat the right amount of calories for you.Get information about a proper diet from your health care provider, if necessary.  Regular physical exercise is one of the most important things you can do for your health. Most adults should get at least 150 minutes of moderate-intensity exercise (any activity that increases your heart rate and causes you to sweat) each week. In addition, most adults need muscle-strengthening exercises on 2 or more days a week.  Maintain a healthy weight. The body mass index (BMI) is a screening tool to identify possible weight problems. It provides an estimate of body fat based on height and weight. Your health care provider can find your BMI and can help you achieve or maintain a healthy weight.For adults 20 years and older:  A BMI below 18.5 is considered underweight.  A BMI of 18.5 to 24.9 is normal.  A BMI of 25 to 29.9 is considered overweight.  A BMI of  30 and above is considered obese.  Maintain normal blood lipids and cholesterol levels by exercising and minimizing your intake of saturated fat. Eat a balanced diet with plenty of fruit and vegetables. Blood tests for lipids and cholesterol should begin at age 76 and be repeated every 5 years. If your lipid or cholesterol levels are high, you are over 50, or you are at high risk for heart disease, you may need your cholesterol levels checked more frequently.Ongoing high lipid and cholesterol levels should be treated with medicines if diet and exercise are not working.  If you smoke, find out from your health care provider how to quit. If you do not use tobacco, do not start.  Lung cancer screening is recommended for adults aged 22-80 years who are at high risk for developing lung cancer because of a history of smoking. A yearly low-dose CT scan of the lungs is recommended for people who have at least a 30-pack-year history of smoking and are a current smoker or have quit within the past 15 years. A pack year of smoking is smoking an average of 1 pack of cigarettes a day for 1 year (for example: 1 pack a day for 30 years or 2 packs a day for 15 years). Yearly screening should continue until the smoker has stopped smoking for at least 15 years. Yearly screening should be stopped for people who develop a health problem that would prevent them from having lung cancer treatment.  If you are pregnant, do not drink alcohol. If you are breastfeeding,  be very cautious about drinking alcohol. If you are not pregnant and choose to drink alcohol, do not have more than 1 drink per day. One drink is considered to be 12 ounces (355 mL) of beer, 5 ounces (148 mL) of wine, or 1.5 ounces (44 mL) of liquor.  Avoid use of street drugs. Do not share needles with anyone. Ask for help if you need support or instructions about stopping the use of drugs.  High blood pressure causes heart disease and increases the risk of  stroke. Your blood pressure should be checked at least every 1 to 2 years. Ongoing high blood pressure should be treated with medicines if weight loss and exercise do not work.  If you are 3-86 years old, ask your health care provider if you should take aspirin to prevent strokes.  Diabetes screening involves taking a blood sample to check your fasting blood sugar level. This should be done once every 3 years, after age 67, if you are within normal weight and without risk factors for diabetes. Testing should be considered at a younger age or be carried out more frequently if you are overweight and have at least 1 risk factor for diabetes.  Breast cancer screening is essential preventive care for women. You should practice "breast self-awareness." This means understanding the normal appearance and feel of your breasts and may include breast self-examination. Any changes detected, no matter how small, should be reported to a health care provider. Women in their 8s and 30s should have a clinical breast exam (CBE) by a health care provider as part of a regular health exam every 1 to 3 years. After age 70, women should have a CBE every year. Starting at age 25, women should consider having a mammogram (breast X-ray test) every year. Women who have a family history of breast cancer should talk to their health care provider about genetic screening. Women at a high risk of breast cancer should talk to their health care providers about having an MRI and a mammogram every year.  Breast cancer gene (BRCA)-related cancer risk assessment is recommended for women who have family members with BRCA-related cancers. BRCA-related cancers include breast, ovarian, tubal, and peritoneal cancers. Having family members with these cancers may be associated with an increased risk for harmful changes (mutations) in the breast cancer genes BRCA1 and BRCA2. Results of the assessment will determine the need for genetic counseling and  BRCA1 and BRCA2 testing.  Routine pelvic exams to screen for cancer are no longer recommended for nonpregnant women who are considered low risk for cancer of the pelvic organs (ovaries, uterus, and vagina) and who do not have symptoms. Ask your health care provider if a screening pelvic exam is right for you.  If you have had past treatment for cervical cancer or a condition that could lead to cancer, you need Pap tests and screening for cancer for at least 20 years after your treatment. If Pap tests have been discontinued, your risk factors (such as having a new sexual partner) need to be reassessed to determine if screening should be resumed. Some women have medical problems that increase the chance of getting cervical cancer. In these cases, your health care provider may recommend more frequent screening and Pap tests.  The HPV test is an additional test that may be used for cervical cancer screening. The HPV test looks for the virus that can cause the cell changes on the cervix. The cells collected during the Pap test can be  tested for HPV. The HPV test could be used to screen women aged 30 years and older, and should be used in women of any age who have unclear Pap test results. After the age of 30, women should have HPV testing at the same frequency as a Pap test.  Colorectal cancer can be detected and often prevented. Most routine colorectal cancer screening begins at the age of 50 years and continues through age 75 years. However, your health care provider may recommend screening at an earlier age if you have risk factors for colon cancer. On a yearly basis, your health care provider may provide home test kits to check for hidden blood in the stool. Use of a small camera at the end of a tube, to directly examine the colon (sigmoidoscopy or colonoscopy), can detect the earliest forms of colorectal cancer. Talk to your health care provider about this at age 50, when routine screening begins. Direct  exam of the colon should be repeated every 5-10 years through age 75 years, unless early forms of pre-cancerous polyps or small growths are found.  People who are at an increased risk for hepatitis B should be screened for this virus. You are considered at high risk for hepatitis B if:  You were born in a country where hepatitis B occurs often. Talk with your health care provider about which countries are considered high risk.  Your parents were born in a high-risk country and you have not received a shot to protect against hepatitis B (hepatitis B vaccine).  You have HIV or AIDS.  You use needles to inject street drugs.  You live with, or have sex with, someone who has hepatitis B.  You get hemodialysis treatment.  You take certain medicines for conditions like cancer, organ transplantation, and autoimmune conditions.  Hepatitis C blood testing is recommended for all people born from 1945 through 1965 and any individual with known risks for hepatitis C.  Practice safe sex. Use condoms and avoid high-risk sexual practices to reduce the spread of sexually transmitted infections (STIs). STIs include gonorrhea, chlamydia, syphilis, trichomonas, herpes, HPV, and human immunodeficiency virus (HIV). Herpes, HIV, and HPV are viral illnesses that have no cure. They can result in disability, cancer, and death.  You should be screened for sexually transmitted illnesses (STIs) including gonorrhea and chlamydia if:  You are sexually active and are younger than 24 years.  You are older than 24 years and your health care provider tells you that you are at risk for this type of infection.  Your sexual activity has changed since you were last screened and you are at an increased risk for chlamydia or gonorrhea. Ask your health care provider if you are at risk.  If you are at risk of being infected with HIV, it is recommended that you take a prescription medicine daily to prevent HIV infection. This is  called preexposure prophylaxis (PrEP). You are considered at risk if:  You are a heterosexual woman, are sexually active, and are at increased risk for HIV infection.  You take drugs by injection.  You are sexually active with a partner who has HIV.  Talk with your health care provider about whether you are at high risk of being infected with HIV. If you choose to begin PrEP, you should first be tested for HIV. You should then be tested every 3 months for as long as you are taking PrEP.  Osteoporosis is a disease in which the bones lose minerals and strength   with aging. This can result in serious bone fractures or breaks. The risk of osteoporosis can be identified using a bone density scan. Women ages 65 years and over and women at risk for fractures or osteoporosis should discuss screening with their health care providers. Ask your health care provider whether you should take a calcium supplement or vitamin D to reduce the rate of osteoporosis.  Menopause can be associated with physical symptoms and risks. Hormone replacement therapy is available to decrease symptoms and risks. You should talk to your health care provider about whether hormone replacement therapy is right for you.  Use sunscreen. Apply sunscreen liberally and repeatedly throughout the day. You should seek shade when your shadow is shorter than you. Protect yourself by wearing long sleeves, pants, a wide-brimmed hat, and sunglasses year round, whenever you are outdoors.  Once a month, do a whole body skin exam, using a mirror to look at the skin on your back. Tell your health care provider of new moles, moles that have irregular borders, moles that are larger than a pencil eraser, or moles that have changed in shape or color.  Stay current with required vaccines (immunizations).  Influenza vaccine. All adults should be immunized every year.  Tetanus, diphtheria, and acellular pertussis (Td, Tdap) vaccine. Pregnant women should  receive 1 dose of Tdap vaccine during each pregnancy. The dose should be obtained regardless of the length of time since the last dose. Immunization is preferred during the 27th-36th week of gestation. An adult who has not previously received Tdap or who does not know her vaccine status should receive 1 dose of Tdap. This initial dose should be followed by tetanus and diphtheria toxoids (Td) booster doses every 10 years. Adults with an unknown or incomplete history of completing a 3-dose immunization series with Td-containing vaccines should begin or complete a primary immunization series including a Tdap dose. Adults should receive a Td booster every 10 years.  Varicella vaccine. An adult without evidence of immunity to varicella should receive 2 doses or a second dose if she has previously received 1 dose. Pregnant females who do not have evidence of immunity should receive the first dose after pregnancy. This first dose should be obtained before leaving the health care facility. The second dose should be obtained 4-8 weeks after the first dose.  Human papillomavirus (HPV) vaccine. Females aged 13-26 years who have not received the vaccine previously should obtain the 3-dose series. The vaccine is not recommended for use in pregnant females. However, pregnancy testing is not needed before receiving a dose. If a female is found to be pregnant after receiving a dose, no treatment is needed. In that case, the remaining doses should be delayed until after the pregnancy. Immunization is recommended for any person with an immunocompromised condition through the age of 26 years if she did not get any or all doses earlier. During the 3-dose series, the second dose should be obtained 4-8 weeks after the first dose. The third dose should be obtained 24 weeks after the first dose and 16 weeks after the second dose.  Zoster vaccine. One dose is recommended for adults aged 60 years or older unless certain conditions are  present.  Measles, mumps, and rubella (MMR) vaccine. Adults born before 1957 generally are considered immune to measles and mumps. Adults born in 1957 or later should have 1 or more doses of MMR vaccine unless there is a contraindication to the vaccine or there is laboratory evidence of immunity to   each of the three diseases. A routine second dose of MMR vaccine should be obtained at least 28 days after the first dose for students attending postsecondary schools, health care workers, or international travelers. People who received inactivated measles vaccine or an unknown type of measles vaccine during 1963-1967 should receive 2 doses of MMR vaccine. People who received inactivated mumps vaccine or an unknown type of mumps vaccine before 1979 and are at high risk for mumps infection should consider immunization with 2 doses of MMR vaccine. For females of childbearing age, rubella immunity should be determined. If there is no evidence of immunity, females who are not pregnant should be vaccinated. If there is no evidence of immunity, females who are pregnant should delay immunization until after pregnancy. Unvaccinated health care workers born before 1957 who lack laboratory evidence of measles, mumps, or rubella immunity or laboratory confirmation of disease should consider measles and mumps immunization with 2 doses of MMR vaccine or rubella immunization with 1 dose of MMR vaccine.  Pneumococcal 13-valent conjugate (PCV13) vaccine. When indicated, a person who is uncertain of her immunization history and has no record of immunization should receive the PCV13 vaccine. An adult aged 19 years or older who has certain medical conditions and has not been previously immunized should receive 1 dose of PCV13 vaccine. This PCV13 should be followed with a dose of pneumococcal polysaccharide (PPSV23) vaccine. The PPSV23 vaccine dose should be obtained at least 8 weeks after the dose of PCV13 vaccine. An adult aged 19  years or older who has certain medical conditions and previously received 1 or more doses of PPSV23 vaccine should receive 1 dose of PCV13. The PCV13 vaccine dose should be obtained 1 or more years after the last PPSV23 vaccine dose.  Pneumococcal polysaccharide (PPSV23) vaccine. When PCV13 is also indicated, PCV13 should be obtained first. All adults aged 65 years and older should be immunized. An adult younger than age 65 years who has certain medical conditions should be immunized. Any person who resides in a nursing home or long-term care facility should be immunized. An adult smoker should be immunized. People with an immunocompromised condition and certain other conditions should receive both PCV13 and PPSV23 vaccines. People with human immunodeficiency virus (HIV) infection should be immunized as soon as possible after diagnosis. Immunization during chemotherapy or radiation therapy should be avoided. Routine use of PPSV23 vaccine is not recommended for American Indians, [place] Natives, or people younger than 65 years unless there are medical conditions that require PPSV23 vaccine. When indicated, people who have unknown immunization and have no record of immunization should receive PPSV23 vaccine. One-time revaccination 5 years after the first dose of PPSV23 is recommended for people aged 19-64 years who have chronic kidney failure, nephrotic syndrome, asplenia, or immunocompromised conditions. People who received 1-2 doses of PPSV23 before age 65 years should receive another dose of PPSV23 vaccine at age 65 years or later if at least 5 years have passed since the previous dose. Doses of PPSV23 are not needed for people immunized with PPSV23 at or after age 65 years.  Meningococcal vaccine. Adults with asplenia or persistent complement component deficiencies should receive 2 doses of quadrivalent meningococcal conjugate (MenACWY-D) vaccine. The doses should be obtained at least 2 months apart.  Microbiologists working with certain meningococcal bacteria, military recruits, people at risk during an outbreak, and people who travel to or live in countries with a high rate of meningitis should be immunized. A first-year college student up through age   21 years who is living in a residence hall should receive a dose if she did not receive a dose on or after her 16th birthday. Adults who have certain high-risk conditions should receive one or more doses of vaccine.  Hepatitis A vaccine. Adults who wish to be protected from this disease, have certain high-risk conditions, work with hepatitis A-infected animals, work in hepatitis A research labs, or travel to or work in countries with a high rate of hepatitis A should be immunized. Adults who were previously unvaccinated and who anticipate close contact with an international adoptee during the first 60 days after arrival in the [place] from a country with a high rate of hepatitis A should be immunized.  Hepatitis B vaccine. Adults who wish to be protected from this disease, have certain high-risk conditions, may be exposed to blood or other infectious body fluids, are household contacts or sex partners of hepatitis B positive people, are clients or workers in certain care facilities, or travel to or work in countries with a high rate of hepatitis B should be immunized.  Haemophilus influenzae type b (Hib) vaccine. A previously unvaccinated person with asplenia or sickle cell disease or having a scheduled splenectomy should receive 1 dose of Hib vaccine. Regardless of previous immunization, a recipient of a hematopoietic stem cell transplant should receive a 3-dose series 6-12 months after her successful transplant. Hib vaccine is not recommended for adults with HIV infection. Preventive Services / Frequency Ages 64 to 68 years  Blood pressure check.** / Every 1 to 2 years.  Lipid and cholesterol check.** / Every 5 years beginning at age  22.  Clinical breast exam.** / Every 3 years for women in their 88s and 53s.  BRCA-related cancer risk assessment.** / For women who have family members with a BRCA-related cancer (breast, ovarian, tubal, or peritoneal cancers).  Pap test.** / Every 2 years from ages 90 through 51. Every 3 years starting at age 21 through age 56 or 3 with a history of 3 consecutive normal Pap tests.  HPV screening.** / Every 3 years from ages 24 through ages 1 to 46 with a history of 3 consecutive normal Pap tests.  Hepatitis C blood test.** / For any individual with known risks for hepatitis C.  Skin self-exam. / Monthly.  Influenza vaccine. / Every year.  Tetanus, diphtheria, and acellular pertussis (Tdap, Td) vaccine.** / Consult your health care provider. Pregnant women should receive 1 dose of Tdap vaccine during each pregnancy. 1 dose of Td every 10 years.  Varicella vaccine.** / Consult your health care provider. Pregnant females who do not have evidence of immunity should receive the first dose after pregnancy.  HPV vaccine. / 3 doses over 6 months, if 72 and younger. The vaccine is not recommended for use in pregnant females. However, pregnancy testing is not needed before receiving a dose.  Measles, mumps, rubella (MMR) vaccine.** / You need at least 1 dose of MMR if you were born in 1957 or later. You may also need a 2nd dose. For females of childbearing age, rubella immunity should be determined. If there is no evidence of immunity, females who are not pregnant should be vaccinated. If there is no evidence of immunity, females who are pregnant should delay immunization until after pregnancy.  Pneumococcal 13-valent conjugate (PCV13) vaccine.** / Consult your health care provider.  Pneumococcal polysaccharide (PPSV23) vaccine.** / 1 to 2 doses if you smoke cigarettes or if you have certain conditions.  Meningococcal vaccine.** /  1 dose if you are age 19 to 21 years and a first-year college  student living in a residence hall, or have one of several medical conditions, you need to get vaccinated against meningococcal disease. You may also need additional booster doses.  Hepatitis A vaccine.** / Consult your health care provider.  Hepatitis B vaccine.** / Consult your health care provider.  Haemophilus influenzae type b (Hib) vaccine.** / Consult your health care provider. Ages 40 to 64 years  Blood pressure check.** / Every 1 to 2 years.  Lipid and cholesterol check.** / Every 5 years beginning at age 20 years.  Lung cancer screening. / Every year if you are aged 55-80 years and have a 30-pack-year history of smoking and currently smoke or have quit within the past 15 years. Yearly screening is stopped once you have quit smoking for at least 15 years or develop a health problem that would prevent you from having lung cancer treatment.  Clinical breast exam.** / Every year after age 40 years.  BRCA-related cancer risk assessment.** / For women who have family members with a BRCA-related cancer (breast, ovarian, tubal, or peritoneal cancers).  Mammogram.** / Every year beginning at age 40 years and continuing for as long as you are in good health. Consult with your health care provider.  Pap test.** / Every 3 years starting at age 30 years through age 65 or 70 years with a history of 3 consecutive normal Pap tests.  HPV screening.** / Every 3 years from ages 30 years through ages 65 to 70 years with a history of 3 consecutive normal Pap tests.  Fecal occult blood test (FOBT) of stool. / Every year beginning at age 50 years and continuing until age 75 years. You may not need to do this test if you get a colonoscopy every 10 years.  Flexible sigmoidoscopy or colonoscopy.** / Every 5 years for a flexible sigmoidoscopy or every 10 years for a colonoscopy beginning at age 50 years and continuing until age 75 years.  Hepatitis C blood test.** / For all people born from 1945 through  1965 and any individual with known risks for hepatitis C.  Skin self-exam. / Monthly.  Influenza vaccine. / Every year.  Tetanus, diphtheria, and acellular pertussis (Tdap/Td) vaccine.** / Consult your health care provider. Pregnant women should receive 1 dose of Tdap vaccine during each pregnancy. 1 dose of Td every 10 years.  Varicella vaccine.** / Consult your health care provider. Pregnant females who do not have evidence of immunity should receive the first dose after pregnancy.  Zoster vaccine.** / 1 dose for adults aged 60 years or older.  Measles, mumps, rubella (MMR) vaccine.** / You need at least 1 dose of MMR if you were born in 1957 or later. You may also need a 2nd dose. For females of childbearing age, rubella immunity should be determined. If there is no evidence of immunity, females who are not pregnant should be vaccinated. If there is no evidence of immunity, females who are pregnant should delay immunization until after pregnancy.  Pneumococcal 13-valent conjugate (PCV13) vaccine.** / Consult your health care provider.  Pneumococcal polysaccharide (PPSV23) vaccine.** / 1 to 2 doses if you smoke cigarettes or if you have certain conditions.  Meningococcal vaccine.** / Consult your health care provider.  Hepatitis A vaccine.** / Consult your health care provider.  Hepatitis B vaccine.** / Consult your health care provider.  Haemophilus influenzae type b (Hib) vaccine.** / Consult your health care provider. Ages 65   years and over  Blood pressure check.** / Every 1 to 2 years.  Lipid and cholesterol check.** / Every 5 years beginning at age 22 years.  Lung cancer screening. / Every year if you are aged 73-80 years and have a 30-pack-year history of smoking and currently smoke or have quit within the past 15 years. Yearly screening is stopped once you have quit smoking for at least 15 years or develop a health problem that would prevent you from having lung cancer  treatment.  Clinical breast exam.** / Every year after age 4 years.  BRCA-related cancer risk assessment.** / For women who have family members with a BRCA-related cancer (breast, ovarian, tubal, or peritoneal cancers).  Mammogram.** / Every year beginning at age 40 years and continuing for as long as you are in good health. Consult with your health care provider.  Pap test.** / Every 3 years starting at age 9 years through age 34 or 91 years with 3 consecutive normal Pap tests. Testing can be stopped between 65 and 70 years with 3 consecutive normal Pap tests and no abnormal Pap or HPV tests in the past 10 years.  HPV screening.** / Every 3 years from ages 57 years through ages 64 or 45 years with a history of 3 consecutive normal Pap tests. Testing can be stopped between 65 and 70 years with 3 consecutive normal Pap tests and no abnormal Pap or HPV tests in the past 10 years.  Fecal occult blood test (FOBT) of stool. / Every year beginning at age 15 years and continuing until age 17 years. You may not need to do this test if you get a colonoscopy every 10 years.  Flexible sigmoidoscopy or colonoscopy.** / Every 5 years for a flexible sigmoidoscopy or every 10 years for a colonoscopy beginning at age 86 years and continuing until age 71 years.  Hepatitis C blood test.** / For all people born from [birth year] through 1964 and any individual with known risks for hepatitis C.  Osteoporosis screening.** / A one-time screening for women ages 83 years and over and women at risk for fractures or osteoporosis.  Skin self-exam. / Monthly.  Influenza vaccine. / Every year.  Tetanus, diphtheria, and acellular pertussis (Tdap/Td) vaccine.** / 1 dose of Td every 10 years.  Varicella vaccine.** / Consult your health care provider.  Zoster vaccine.** / 1 dose for adults aged 61 years or older.  Pneumococcal 13-valent conjugate (PCV13) vaccine.** / Consult your health care provider.  Pneumococcal  polysaccharide (PPSV23) vaccine.** / 1 dose for all adults aged 28 years and older.  Meningococcal vaccine.** / Consult your health care provider.  Hepatitis A vaccine.** / Consult your health care provider.  Hepatitis B vaccine.** / Consult your health care provider.  Haemophilus influenzae type b (Hib) vaccine.** / Consult your health care provider. ** Family history and personal history of risk and conditions may change your health care provider's recommendations. Document Released: 03/28/2001 Document Revised: 06/16/2013 Document Reviewed: 06/27/2010 [facility] Patient Information 2015 [place], [place]. This information is not intended to replace advice given to you by your health care provider. Make sure you discuss any questions you have with your health care provider.

## 2014-02-17 NOTE — Progress Notes (Signed)
Pre visit review using our clinic review tool, if applicable. No additional management support is needed unless otherwise documented below in the visit note. 

## 2014-02-19 ENCOUNTER — Ambulatory Visit
Admission: RE | Admit: 2014-02-19 | Discharge: 2014-02-19 | Disposition: A | Discharge status: Home / Self Care | Payer: Managed Care, Other (non HMO) | Source: Ambulatory Visit | Attending: Family Medicine | Admitting: Family Medicine

## 2014-02-19 ENCOUNTER — Other Ambulatory Visit: Payer: Self-pay | Admitting: Family Medicine

## 2014-02-19 DIAGNOSIS — R2232 Localized swelling, mass and lump, left upper limb: Secondary | ICD-10-CM

## 2014-02-19 DIAGNOSIS — N6320 Unspecified lump in the left breast, unspecified quadrant: Secondary | ICD-10-CM

## 2014-03-17 ENCOUNTER — Telehealth: Payer: Self-pay | Admitting: Family Medicine

## 2014-03-17 NOTE — Telephone Encounter (Signed)
Spoke with patient and she said she is hungry more ,I gave her Dr.Lowne's recommendations and  she said she is willing to give it two more weeks and will call back if it does not change

## 2014-03-17 NOTE — Telephone Encounter (Signed)
When did she actually start taking it?  It can take a few months to see mapax effect.  ---- other option is to stop it.

## 2014-03-17 NOTE — Telephone Encounter (Signed)
Caller name: Luberta RobertsonDehart-Burris, Corey Relation to pt: self  Call back number: [phone]   Reason for call:  Pt does not know what the medication Naltrexone-Bupropion HCl ER (CONTRAVE) 8-90 MG TB12 is doing for her and pt states its not surpressing her appetite

## 2014-03-17 NOTE — Telephone Encounter (Signed)
To MD to review and advise      KP 

## 2014-03-23 ENCOUNTER — Telehealth: Payer: Self-pay | Admitting: Family Medicine

## 2014-03-23 NOTE — Telephone Encounter (Signed)
Patient aware, apt scheduled for Thursday @ 4 pm

## 2014-03-23 NOTE — Telephone Encounter (Signed)
Please advise      KP 

## 2014-03-23 NOTE — Telephone Encounter (Signed)
Caller name: Luberta RobertsonDehart-Burris, Lillyen Relation to pt: self  Call back number: [phone] Pharmacy:  Reason for call:  Pt was itching she stopped taking Naltrexone-Bupropion HCl ER (CONTRAVE) 8-90 MG TB12 03/17/14 pt is still itching.

## 2014-03-23 NOTE — Telephone Encounter (Signed)
adipex 37.5  #30  1 po qam--- needs ov in 1 month---- if ekg not done in last 6 months will need one

## 2014-03-23 NOTE — Telephone Encounter (Signed)
Any rash?  Stay off med for now -- take benadryl---- if it clears up can start contrave again --if itchy Again we know its contrave If rash ---she can come in -- may need pred

## 2014-03-23 NOTE — Telephone Encounter (Signed)
No rash, she stopped the medication a few days ago and the itching was much better. She would like to try the Phentermine that she was on before she does not want the contrave back.     KP

## 2014-03-26 ENCOUNTER — Ambulatory Visit (INDEPENDENT_AMBULATORY_CARE_PROVIDER_SITE_OTHER): Payer: Managed Care, Other (non HMO) | Admitting: Family Medicine

## 2014-03-26 ENCOUNTER — Encounter: Payer: Self-pay | Admitting: Family Medicine

## 2014-03-26 VITALS — BP 131/60 | HR 63 | Temp 98.3°F | Wt 229.4 lb

## 2014-03-26 DIAGNOSIS — E669 Obesity, unspecified: Secondary | ICD-10-CM

## 2014-03-26 DIAGNOSIS — I10 Essential (primary) hypertension: Secondary | ICD-10-CM

## 2014-03-26 MED ORDER — PHENTERMINE HCL 37.5 MG PO TABS: ORAL_TABLET | ORAL | Status: DC

## 2014-03-26 NOTE — Progress Notes (Signed)
Subjective:    Patient ID: Helen Miles, female    DOB: 03-Oct-1970, 44 y.o.   MRN: [id number]  HPI  Patient here for f/u weight loss. Pt had reaction to contrave.  She started itching.  It has subsided since she stopped it.  She would like to go back on adipex.   Past Medical History  Diagnosis Date  . Hyperlipidemia   . Generalized headaches     Review of Systems  Constitutional: Positive for fatigue. Negative for activity change, appetite change and unexpected weight change.  Respiratory: Negative for cough and shortness of breath.   Cardiovascular: Negative for chest pain and palpitations.  Psychiatric/Behavioral: Negative for behavioral problems and dysphoric mood. The patient is not nervous/anxious.        Objective:    Physical Exam  Constitutional: She is oriented to person, place, and time. She appears well-developed and well-nourished. No distress.  HENT:  Right Ear: External ear normal.  Left Ear: External ear normal.  Nose: Nose normal.  Mouth/Throat: Oropharynx is clear and moist.  Eyes: EOM are normal. Pupils are equal, round, and reactive to light.  Neck: Normal range of motion. Neck supple.  Cardiovascular: Normal rate, regular rhythm and normal heart sounds.   No murmur heard. Pulmonary/Chest: Effort normal and breath sounds normal. No respiratory distress. She has no wheezes. She has no rales. She exhibits no tenderness.  Neurological: She is alert and oriented to person, place, and time.  Psychiatric: She has a normal mood and affect. Her behavior is normal. Judgment and thought content normal.   ekg-- NSR no abnormalities  BP 131/60 mmHg  Pulse 63  Temp(Src) 98.3 F (36.8 C) (Oral)  Wt 229 lb 6.4 oz (104.055 kg)  SpO2 99% Wt Readings from Last 3 Encounters:  03/26/14 229 lb 6.4 oz (104.055 kg)  02/17/14 226 lb (102.513 kg)  10/27/12 242 lb (109.77 kg)     Lab Results  Component Value Date   WBC 4.2* 12/06/2011   HGB 12.5 12/06/2011     HCT 38.7 12/06/2011   PLT 191.0 12/06/2011   GLUCOSE 84 11/07/2013   CHOL 181 11/07/2013   TRIG 50.0 11/07/2013   HDL 55.30 11/07/2013   LDLCALC 116* 11/07/2013   ALT 16 11/07/2013   AST 18 11/07/2013   NA 133* 11/07/2013   K 3.9 11/07/2013   CL 103 11/07/2013   CREATININE 1.1 11/07/2013   BUN 15 11/07/2013   CO2 24 11/07/2013   TSH 1.513 02/17/2014   HGBA1C 5.1 12/06/2011    [place] Extrem Up Left Ltd  02/26/2014   ADDENDUM REPORT: 02/26/2014 09:27  ADDENDUM: BI-RADS category:  2- Benign  Recommendation: Screen bilateral mammography in July 2016 per normal screening schedule.   Electronically Signed   By: Amie Portland M.D.   On: 02/26/2014 09:27   02/26/2014   CLINICAL DATA:  Palpable abnormality in the left axilla.  EXAM: ULTRASOUND LEFT UPPER EXTREMITY LIMITED  TECHNIQUE: Ultrasound examination of the upper extremity soft tissues was performed in the area of clinical concern.  COMPARISON:  Prior mammograms  FINDINGS: On physical exam, there is a small red bump along the peripheral aspect of the left axilla, at its junction with the medial left arm. This appears to reside in the deep dermis.  Sonographic imaging of this lesion demonstrates a small, oval, 5 mm x 4 mm x 5 mm complex cystic lesion which resides in the deep dermis. This is consistent with a skin appendage cyst, and may  reflect trapped hair follicle or a sebaceous cyst or other inclusion cyst. This is a benign finding.  IMPRESSION: No evidence of malignancy. Benign skin appendage cyst in medial proximal left arm at its junction with the left axilla.  Electronically Signed: By: Amie Portlandavid  Ormond M.D. On: 02/19/2014 09:14       Assessment & Plan:   Problem List Items Addressed This Visit    Obesity, morbid    D/w pt diet and exercise rto 1 month or sooner prn      Relevant Medications   ADIPEX-P 37.5 MG PO TABS    Other Visit Diagnoses    Essential hypertension    -  Primary    Obesity        Relevant Medications     ADIPEX-P 37.5 MG PO TABS    Other Relevant Orders    EKG 12-Lead (Completed)        Loreen FreudYvonne Lowne, DO

## 2014-03-26 NOTE — Assessment & Plan Note (Signed)
D/w pt diet and exercise rto 1 month or sooner prn

## 2014-03-26 NOTE — Patient Instructions (Signed)
Exercise to Lose Weight Exercise and a healthy diet may help you lose weight. Your doctor may suggest specific exercises. EXERCISE IDEAS AND TIPS  Choose low-cost things you enjoy doing, such as walking, bicycling, or exercising to workout videos.  Take stairs instead of the elevator.  Walk during your lunch break.  Park your car further away from work or school.  Go to a gym or an exercise class.  Start with 5 to 10 minutes of exercise each day. Build up to 30 minutes of exercise 4 to 6 days a week.  Wear shoes with good support and comfortable clothes.  Stretch before and after working out.  Work out until you breathe harder and your heart beats faster.  Drink extra water when you exercise.  Do not do so much that you hurt yourself, feel dizzy, or get very short of breath. Exercises that burn about 150 calories:  Running 1  miles in 15 minutes.  Playing volleyball for 45 to 60 minutes.  Washing and waxing a car for 45 to 60 minutes.  Playing touch football for 45 minutes.  Walking 1  miles in 35 minutes.  Pushing a stroller 1  miles in 30 minutes.  Playing basketball for 30 minutes.  Raking leaves for 30 minutes.  Bicycling 5 miles in 30 minutes.  Walking 2 miles in 30 minutes.  Dancing for 30 minutes.  Shoveling snow for 15 minutes.  Swimming laps for 20 minutes.  Walking up stairs for 15 minutes.  Bicycling 4 miles in 15 minutes.  Gardening for 30 to 45 minutes.  Jumping rope for 15 minutes.  Washing windows or floors for 45 to 60 minutes. Document Released: 03/04/2010 Document Revised: 04/24/2011 Document Reviewed: 03/04/2010 [employer] Patient Information 2015 [employer]. This information is not intended to replace advice given to you by your health care provider. Make sure you discuss any questions you have with your health care provider.  

## 2014-03-26 NOTE — Progress Notes (Signed)
Pre visit review using our clinic review tool, if applicable. No additional management support is needed unless otherwise documented below in the visit note. 

## 2014-04-28 ENCOUNTER — Ambulatory Visit (INDEPENDENT_AMBULATORY_CARE_PROVIDER_SITE_OTHER): Payer: Managed Care, Other (non HMO) | Admitting: Family Medicine

## 2014-04-28 ENCOUNTER — Encounter: Payer: Self-pay | Admitting: Family Medicine

## 2014-04-28 VITALS — BP 124/80 | HR 81 | Temp 98.7°F | Wt 222.4 lb

## 2014-04-28 DIAGNOSIS — E669 Obesity, unspecified: Secondary | ICD-10-CM

## 2014-04-28 DIAGNOSIS — J208 Acute bronchitis due to other specified organisms: Secondary | ICD-10-CM

## 2014-04-28 MED ORDER — PHENTERMINE HCL 37.5 MG PO TABS: ORAL_TABLET | ORAL | Status: DC

## 2014-04-28 NOTE — Progress Notes (Signed)
Pre visit review using our clinic review tool, if applicable. No additional management support is needed unless otherwise documented below in the visit note. 

## 2014-04-28 NOTE — Patient Instructions (Signed)
Exercise to Lose Weight Exercise and a healthy diet may help you lose weight. Your doctor may suggest specific exercises. EXERCISE IDEAS AND TIPS  Choose low-cost things you enjoy doing, such as walking, bicycling, or exercising to workout videos.  Take stairs instead of the elevator.  Walk during your lunch break.  Park your car further away from work or school.  Go to a gym or an exercise class.  Start with 5 to 10 minutes of exercise each day. Build up to 30 minutes of exercise 4 to 6 days a week.  Wear shoes with good support and comfortable clothes.  Stretch before and after working out.  Work out until you breathe harder and your heart beats faster.  Drink extra water when you exercise.  Do not do so much that you hurt yourself, feel dizzy, or get very short of breath. Exercises that burn about 150 calories:  Running 1  miles in 15 minutes.  Playing volleyball for 45 to 60 minutes.  Washing and waxing a car for 45 to 60 minutes.  Playing touch football for 45 minutes.  Walking 1  miles in 35 minutes.  Pushing a stroller 1  miles in 30 minutes.  Playing basketball for 30 minutes.  Raking leaves for 30 minutes.  Bicycling 5 miles in 30 minutes.  Walking 2 miles in 30 minutes.  Dancing for 30 minutes.  Shoveling snow for 15 minutes.  Swimming laps for 20 minutes.  Walking up stairs for 15 minutes.  Bicycling 4 miles in 15 minutes.  Gardening for 30 to 45 minutes.  Jumping rope for 15 minutes.  Washing windows or floors for 45 to 60 minutes. Document Released: 03/04/2010 Document Revised: 04/24/2011 Document Reviewed: 03/04/2010 [employer] Patient Information 2015 [employer]. This information is not intended to replace advice given to you by your health care provider. Make sure you discuss any questions you have with your health care provider.  

## 2014-04-28 NOTE — Progress Notes (Signed)
   Subjective:    Patient ID: Helen Miles, female    DOB: 10/15/1970, 44 y.o.   MRN: [id number]  HPI  Patient here for f/u weight  Past Medical History  Diagnosis Date  . Hyperlipidemia   . Generalized headaches     Review of Systems  Constitutional: Negative for activity change, appetite change, fatigue and unexpected weight change.  Respiratory: Negative for cough and shortness of breath.   Cardiovascular: Negative for chest pain and palpitations.  Psychiatric/Behavioral: Negative for behavioral problems and dysphoric mood. The patient is not nervous/anxious.     Current Outpatient Prescriptions on File Prior to Visit  Medication Sig Dispense Refill  . FIBER COMPLETE PO Take by mouth. 2 by mouth 2-3 x daily     No current facility-administered medications on file prior to visit.       Objective:    Physical Exam  Constitutional: She is oriented to person, place, and time. She appears well-developed and well-nourished. No distress.  HENT:  Right Ear: External ear normal.  Left Ear: External ear normal.  Nose: Nose normal.  Mouth/Throat: Oropharynx is clear and moist.  Eyes: EOM are normal. Pupils are equal, round, and reactive to light.  Neck: Normal range of motion. Neck supple.  Cardiovascular: Normal rate, regular rhythm and normal heart sounds.   No murmur heard. Pulmonary/Chest: Effort normal and breath sounds normal. No respiratory distress. She has no wheezes. She has no rales. She exhibits no tenderness.  Neurological: She is alert and oriented to person, place, and time.  Psychiatric: She has a normal mood and affect. Her behavior is normal. Judgment and thought content normal.    BP 124/80 mmHg  Pulse 81  Temp(Src) 98.7 F (37.1 C) (Oral)  Wt 222 lb 6.4 oz (100.88 kg)  SpO2 95% Wt Readings from Last 3 Encounters:  04/28/14 222 lb 6.4 oz (100.88 kg)  03/26/14 229 lb 6.4 oz (104.055 kg)  02/17/14 226 lb (102.513 kg)     Lab Results    Component Value Date   WBC 4.2* 12/06/2011   HGB 12.5 12/06/2011   HCT 38.7 12/06/2011   PLT 191.0 12/06/2011   GLUCOSE 84 11/07/2013   CHOL 181 11/07/2013   TRIG 50.0 11/07/2013   HDL 55.30 11/07/2013   LDLCALC 116* 11/07/2013   ALT 16 11/07/2013   AST 18 11/07/2013   NA 133* 11/07/2013   K 3.9 11/07/2013   CL 103 11/07/2013   CREATININE 1.1 11/07/2013   BUN 15 11/07/2013   CO2 24 11/07/2013   TSH 1.513 02/17/2014   HGBA1C 5.1 12/06/2011       Assessment & Plan:   Problem List Items Addressed This Visit      Unprioritized   Obesity, morbid    con't diet and exercise con't adipex rto 1 months      Relevant Medications   phentermine (ADIPEX-P) 37.5 MG tablet    Other Visit Diagnoses    Acute bronchitis due to other specified organisms    -  Primary    Obesity        Relevant Medications    phentermine (ADIPEX-P) 37.5 MG tablet       I am having Ms. Miles maintain her FIBER COMPLETE PO and phentermine.  Meds ordered this encounter  Medications  . phentermine (ADIPEX-P) 37.5 MG tablet    Sig: 1 po qam    Dispense:  30 tablet    Refill:  0     Loreen FreudYvonne Lowne, DO

## 2014-04-28 NOTE — Assessment & Plan Note (Signed)
con't diet and exercise con't adipex rto 1 months

## 2014-05-18 ENCOUNTER — Ambulatory Visit: Payer: Managed Care, Other (non HMO) | Admitting: Family Medicine

## 2014-06-04 ENCOUNTER — Telehealth: Payer: Self-pay | Admitting: Family Medicine

## 2014-06-04 DIAGNOSIS — E669 Obesity, unspecified: Secondary | ICD-10-CM

## 2014-06-04 MED ORDER — PHENTERMINE HCL 37.5 MG PO TABS: ORAL_TABLET | ORAL | Status: DC

## 2014-06-04 NOTE — Telephone Encounter (Signed)
Rx faxed.    KP 

## 2014-06-04 NOTE — Telephone Encounter (Signed)
Ok for 1 week phenteramine

## 2014-06-04 NOTE — Telephone Encounter (Signed)
Relation to pt: self  Call back [phone] [id number] [place] - [address] AT [facility] [address] [phone] ([phone] (Fax)         Reason for call:  Pt requesting a refill phentermine (ADIPEX-P) 37.5 MG tablet to hold her over until 06/11/14 appointment. Please advise

## 2014-06-04 NOTE — Telephone Encounter (Signed)
Please advise      KP 

## 2014-06-11 ENCOUNTER — Ambulatory Visit (INDEPENDENT_AMBULATORY_CARE_PROVIDER_SITE_OTHER): Payer: Managed Care, Other (non HMO) | Admitting: Family Medicine

## 2014-06-11 ENCOUNTER — Ambulatory Visit: Payer: Managed Care, Other (non HMO) | Admitting: Family Medicine

## 2014-06-11 ENCOUNTER — Encounter: Payer: Self-pay | Admitting: Family Medicine

## 2014-06-11 VITALS — BP 124/86 | HR 71 | Temp 99.2°F | Resp 16 | Ht 62.0 in | Wt 229.0 lb

## 2014-06-11 DIAGNOSIS — E669 Obesity, unspecified: Secondary | ICD-10-CM | POA: Diagnosis not present

## 2014-06-11 MED ORDER — PHENTERMINE HCL 37.5 MG PO TABS: ORAL_TABLET | ORAL | Status: DC

## 2014-06-11 NOTE — Assessment & Plan Note (Signed)
Refill phenteramine Pt to resume diet and exercise rto 1 month---- if no results in 4 weeks Will d/c med

## 2014-06-11 NOTE — Progress Notes (Signed)
Pre visit review using our clinic review tool, if applicable. No additional management support is needed unless otherwise documented below in the visit note. 

## 2014-06-11 NOTE — Progress Notes (Signed)
   Subjective:    Patient ID: Helen Miles, female    DOB: 01/24/1971, 44 y.o.   MRN: [id number]  HPI  Patient here for f/u phenteramine,  She was traveling a lot.  Her schedule is now settling down again.  She usually does treadmill and elliptical 30 min each-- 5 days a week  Past Medical History  Diagnosis Date  . Hyperlipidemia   . Generalized headaches     Review of Systems  Constitutional: Negative for activity change, appetite change, fatigue and unexpected weight change.  Respiratory: Negative for cough and shortness of breath.   Cardiovascular: Negative for chest pain and palpitations.  Psychiatric/Behavioral: Negative for behavioral problems and dysphoric mood. The patient is not nervous/anxious.     No current outpatient prescriptions on file prior to visit.   No current facility-administered medications on file prior to visit.       Objective:    Physical Exam  Constitutional: She is oriented to person, place, and time. She appears well-developed and well-nourished. No distress.  HENT:  Right Ear: External ear normal.  Left Ear: External ear normal.  Nose: Nose normal.  Mouth/Throat: Oropharynx is clear and moist.  Eyes: EOM are normal. Pupils are equal, round, and reactive to light.  Neck: Normal range of motion. Neck supple.  Cardiovascular: Normal rate, regular rhythm and normal heart sounds.   No murmur heard. Pulmonary/Chest: Effort normal and breath sounds normal. No respiratory distress. She has no wheezes. She has no rales. She exhibits no tenderness.  Neurological: She is alert and oriented to person, place, and time.  Psychiatric: She has a normal mood and affect. Her behavior is normal. Judgment and thought content normal.    BP 124/86 mmHg  Pulse 71  Temp(Src) 99.2 F (37.3 C) (Oral)  Resp 16  Ht 5\' 2"  (1.575 m)  Wt 229 lb (103.874 kg)  BMI 41.87 kg/m2  SpO2 98%  LMP 05/21/2014 Wt Readings from Last 3 Encounters:  06/11/14 229 lb  (103.874 kg)  04/28/14 222 lb 6.4 oz (100.88 kg)  03/26/14 229 lb 6.4 oz (104.055 kg)     Lab Results  Component Value Date   WBC 4.2* 12/06/2011   HGB 12.5 12/06/2011   HCT 38.7 12/06/2011   PLT 191.0 12/06/2011   GLUCOSE 84 11/07/2013   CHOL 181 11/07/2013   TRIG 50.0 11/07/2013   HDL 55.30 11/07/2013   LDLCALC 116* 11/07/2013   ALT 16 11/07/2013   AST 18 11/07/2013   NA 133* 11/07/2013   K 3.9 11/07/2013   CL 103 11/07/2013   CREATININE 1.1 11/07/2013   BUN 15 11/07/2013   CO2 24 11/07/2013   TSH 1.513 02/17/2014   HGBA1C 5.1 12/06/2011       Assessment & Plan:   Problem List Items Addressed This Visit    None    Visit Diagnoses    Obesity    -  Primary    Relevant Medications    phentermine (ADIPEX-P) 37.5 MG tablet       I have discontinued Ms. Miles's FIBER COMPLETE PO. I am also having her maintain her senna and phentermine.  Meds ordered this encounter  Medications  . senna (SENOKOT) 8.6 MG tablet    Sig: Take 1 tablet by mouth daily.  . phentermine (ADIPEX-P) 37.5 MG tablet    Sig: 1 po qam    Dispense:  30 tablet    Refill:  0     Loreen FreudYvonne Lowne, DO

## 2014-07-17 ENCOUNTER — Encounter: Payer: Self-pay | Admitting: Family Medicine

## 2014-07-17 ENCOUNTER — Ambulatory Visit (INDEPENDENT_AMBULATORY_CARE_PROVIDER_SITE_OTHER): Payer: Managed Care, Other (non HMO) | Admitting: Family Medicine

## 2014-07-17 VITALS — BP 125/79 | HR 67 | Temp 98.1°F | Ht 62.0 in | Wt 227.0 lb

## 2014-07-17 DIAGNOSIS — E669 Obesity, unspecified: Secondary | ICD-10-CM

## 2014-07-17 MED ORDER — PHENTERMINE HCL 37.5 MG PO TABS: ORAL_TABLET | ORAL | Status: DC

## 2014-07-17 NOTE — Assessment & Plan Note (Signed)
rx adipex for one more month.  Refer to [facility] in [place] for weight loss Pt understands and agrees con't diet and exercise

## 2014-07-17 NOTE — Progress Notes (Signed)
Subjective:    Patient ID: Helen Miles, female    DOB: 09/30/1970, 44 y.o.   MRN: [id number]  HPI  Patient here for f/u weight.  Pt has had a crazy April and is frustrated that she is struggling to lose weight.    Past Medical History  Diagnosis Date  . Hyperlipidemia   . Generalized headaches     Review of Systems  Constitutional: Negative for activity change, appetite change, fatigue and unexpected weight change.  Respiratory: Negative for cough and shortness of breath.   Cardiovascular: Negative for chest pain and palpitations.  Psychiatric/Behavioral: Negative for behavioral problems and dysphoric mood. The patient is not nervous/anxious.     Current Outpatient Prescriptions on File Prior to Visit  Medication Sig Dispense Refill  . senna (SENOKOT) 8.6 MG tablet Take 1 tablet by mouth daily.     No current facility-administered medications on file prior to visit.       Objective:    Physical Exam  Constitutional: She is oriented to person, place, and time. She appears well-developed and well-nourished.  HENT:  Head: Normocephalic and atraumatic.  Eyes: Conjunctivae and EOM are normal.  Neck: Normal range of motion. Neck supple. No JVD present. Carotid bruit is not present. No thyromegaly present.  Cardiovascular: Normal rate, regular rhythm and normal heart sounds.   No murmur heard. Pulmonary/Chest: Effort normal and breath sounds normal. No respiratory distress. She has no wheezes. She has no rales. She exhibits no tenderness.  Musculoskeletal: She exhibits no edema.  Neurological: She is alert and oriented to person, place, and time.  Psychiatric: She has a normal mood and affect. Her behavior is normal.    BP 125/79 mmHg  Pulse 67  Temp(Src) 98.1 F (36.7 C) (Oral)  Ht 5\' 2"  (1.575 m)  Wt 227 lb (102.967 kg)  BMI 41.51 kg/m2  SpO2 96%  LMP 06/14/2014 Wt Readings from Last 3 Encounters:  07/17/14 227 lb (102.967 kg)  06/11/14 229 lb (103.874 kg)    04/28/14 222 lb 6.4 oz (100.88 kg)     Lab Results  Component Value Date   WBC 4.2* 12/06/2011   HGB 12.5 12/06/2011   HCT 38.7 12/06/2011   PLT 191.0 12/06/2011   GLUCOSE 84 11/07/2013   CHOL 181 11/07/2013   TRIG 50.0 11/07/2013   HDL 55.30 11/07/2013   LDLCALC 116* 11/07/2013   ALT 16 11/07/2013   AST 18 11/07/2013   NA 133* 11/07/2013   K 3.9 11/07/2013   CL 103 11/07/2013   CREATININE 1.1 11/07/2013   BUN 15 11/07/2013   CO2 24 11/07/2013   TSH 1.513 02/17/2014   HGBA1C 5.1 12/06/2011       Assessment & Plan:   Problem List Items Addressed This Visit    Obesity, morbid    rx adipex for one more month.  Refer to [facility] in [place] for weight loss Pt understands and agrees con't diet and exercise      Relevant Medications   phentermine (ADIPEX-P) 37.5 MG tablet    Other Visit Diagnoses    Obesity    -  Primary    Relevant Medications    phentermine (ADIPEX-P) 37.5 MG tablet    Other Relevant Orders    Ambulatory referral to General Surgery       I am having Helen Miles maintain her senna and phentermine.  Meds ordered this encounter  Medications  . phentermine (ADIPEX-P) 37.5 MG tablet    Sig: 1 po qam  Dispense:  30 tablet    Refill:  0     Loreen Freud, DO

## 2014-07-17 NOTE — Progress Notes (Signed)
Pre visit review using our clinic review tool, if applicable. No additional management support is needed unless otherwise documented below in the visit note. 

## 2014-07-17 NOTE — Patient Instructions (Signed)
Exercise to Lose Weight Exercise and a healthy diet may help you lose weight. Your doctor may suggest specific exercises. EXERCISE IDEAS AND TIPS  Choose low-cost things you enjoy doing, such as walking, bicycling, or exercising to workout videos.  Take stairs instead of the elevator.  Walk during your lunch break.  Park your car further away from work or school.  Go to a gym or an exercise class.  Start with 5 to 10 minutes of exercise each day. Build up to 30 minutes of exercise 4 to 6 days a week.  Wear shoes with good support and comfortable clothes.  Stretch before and after working out.  Work out until you breathe harder and your heart beats faster.  Drink extra water when you exercise.  Do not do so much that you hurt yourself, feel dizzy, or get very short of breath. Exercises that burn about 150 calories:  Running 1  miles in 15 minutes.  Playing volleyball for 45 to 60 minutes.  Washing and waxing a car for 45 to 60 minutes.  Playing touch football for 45 minutes.  Walking 1  miles in 35 minutes.  Pushing a stroller 1  miles in 30 minutes.  Playing basketball for 30 minutes.  Raking leaves for 30 minutes.  Bicycling 5 miles in 30 minutes.  Walking 2 miles in 30 minutes.  Dancing for 30 minutes.  Shoveling snow for 15 minutes.  Swimming laps for 20 minutes.  Walking up stairs for 15 minutes.  Bicycling 4 miles in 15 minutes.  Gardening for 30 to 45 minutes.  Jumping rope for 15 minutes.  Washing windows or floors for 45 to 60 minutes. Document Released: 03/04/2010 Document Revised: 04/24/2011 Document Reviewed: 03/04/2010 [employer] Patient Information 2015 [employer]. This information is not intended to replace advice given to you by your health care provider. Make sure you discuss any questions you have with your health care provider.  

## 2014-10-26 ENCOUNTER — Other Ambulatory Visit: Payer: Self-pay | Admitting: Obstetrics and Gynecology

## 2014-10-26 ENCOUNTER — Telehealth: Payer: Self-pay | Admitting: Family Medicine

## 2014-10-27 LAB — CYTOLOGY - PAP

## 2014-11-12 NOTE — Telephone Encounter (Signed)
error 

## 2014-12-28 ENCOUNTER — Other Ambulatory Visit: Payer: Self-pay

## 2014-12-28 DIAGNOSIS — Z1231 Encounter for screening mammogram for malignant neoplasm of breast: Secondary | ICD-10-CM

## 2015-01-25 ENCOUNTER — Ambulatory Visit: Payer: Managed Care, Other (non HMO) | Admitting: Family Medicine

## 2015-01-29 ENCOUNTER — Ambulatory Visit
Admission: RE | Admit: 2015-01-29 | Discharge: 2015-01-29 | Disposition: A | Discharge status: Home / Self Care | Payer: Managed Care, Other (non HMO) | Source: Ambulatory Visit

## 2015-01-29 DIAGNOSIS — Z1231 Encounter for screening mammogram for malignant neoplasm of breast: Secondary | ICD-10-CM

## 2015-02-02 ENCOUNTER — Other Ambulatory Visit: Payer: Self-pay | Admitting: Obstetrics and Gynecology

## 2015-02-02 DIAGNOSIS — R928 Other abnormal and inconclusive findings on diagnostic imaging of breast: Secondary | ICD-10-CM

## 2015-02-09 ENCOUNTER — Ambulatory Visit
Admission: RE | Admit: 2015-02-09 | Discharge: 2015-02-09 | Disposition: A | Discharge status: Home / Self Care | Payer: Managed Care, Other (non HMO) | Source: Ambulatory Visit | Attending: Obstetrics and Gynecology | Admitting: Obstetrics and Gynecology

## 2015-02-09 DIAGNOSIS — R928 Other abnormal and inconclusive findings on diagnostic imaging of breast: Secondary | ICD-10-CM

## 2015-04-22 ENCOUNTER — Telehealth: Payer: Self-pay | Admitting: *Deleted

## 2015-04-22 NOTE — Telephone Encounter (Signed)
Unable to reach patient at time of pre-visit call. No message left as voicemail was identified as a business # and did not identify pt's name.

## 2015-04-23 ENCOUNTER — Encounter: Payer: Managed Care, Other (non HMO) | Admitting: Family Medicine

## 2015-08-13 ENCOUNTER — Ambulatory Visit (INDEPENDENT_AMBULATORY_CARE_PROVIDER_SITE_OTHER): Payer: Managed Care, Other (non HMO) | Admitting: Family Medicine

## 2015-08-13 ENCOUNTER — Encounter: Payer: Self-pay | Admitting: Family Medicine

## 2015-08-13 VITALS — BP 128/82 | HR 67 | Temp 98.3°F | Ht 62.0 in | Wt 245.0 lb

## 2015-08-13 DIAGNOSIS — Z0000 Encounter for general adult medical examination without abnormal findings: Secondary | ICD-10-CM

## 2015-08-13 LAB — CBC WITH DIFFERENTIAL/PLATELET
BASOS ABS: 0 {cells}/uL (ref 0–200)
Basophils Relative: 0 %
Eosinophils Absolute: 102 cells/uL (ref 15–500)
Eosinophils Relative: 2 %
HEMATOCRIT: 40.5 % (ref 35.0–45.0)
HEMOGLOBIN: 13.4 g/dL (ref 11.7–15.5)
LYMPHS ABS: 1734 {cells}/uL (ref 850–3900)
LYMPHS PCT: 34 %
MCH: 29.8 pg (ref 27.0–33.0)
MCHC: 33.1 g/dL (ref 32.0–36.0)
MCV: 90.2 fL (ref 80.0–100.0)
MONO ABS: 357 {cells}/uL (ref 200–950)
MPV: 9.3 fL (ref 7.5–12.5)
Monocytes Relative: 7 %
NEUTROS PCT: 57 %
Neutro Abs: 2907 cells/uL (ref 1500–7800)
Platelets: 206 10*3/uL (ref 140–400)
RBC: 4.49 MIL/uL (ref 3.80–5.10)
RDW: 13.1 % (ref 11.0–15.0)
WBC: 5.1 10*3/uL (ref 3.8–10.8)

## 2015-08-13 LAB — POCT URINALYSIS DIPSTICK
Bilirubin, UA: NEGATIVE
Blood, UA: NEGATIVE
Glucose, UA: NEGATIVE
KETONES UA: NEGATIVE
LEUKOCYTES UA: NEGATIVE
Nitrite, UA: NEGATIVE
PH UA: 6
PROTEIN UA: NEGATIVE
SPEC GRAV UA: 1.015
UROBILINOGEN UA: 0.2

## 2015-08-13 LAB — LIPID PANEL
Cholesterol: 172 mg/dL (ref 125–200)
HDL: 70 mg/dL (ref >=46)
LDL CALC: 92 mg/dL (ref <130)
TRIGLYCERIDES: 48 mg/dL (ref <150)
Total CHOL/HDL Ratio: 2.5 Ratio (ref <=5.0)
VLDL: 10 mg/dL (ref <30)

## 2015-08-13 LAB — COMPREHENSIVE METABOLIC PANEL
ALT: 13 U/L (ref 6–29)
AST: 16 U/L (ref 10–30)
Albumin: 4.1 g/dL (ref 3.6–5.1)
Alkaline Phosphatase: 69 U/L (ref 33–115)
BUN: 17 mg/dL (ref 7–25)
CHLORIDE: 103 mmol/L (ref 98–110)
CO2: 25 mmol/L (ref 20–31)
CREATININE: 0.88 mg/dL (ref 0.50–1.10)
Calcium: 9.4 mg/dL (ref 8.6–10.2)
GLUCOSE: 82 mg/dL (ref 65–99)
POTASSIUM: 3.9 mmol/L (ref 3.5–5.3)
SODIUM: 138 mmol/L (ref 135–146)
TOTAL PROTEIN: 6.9 g/dL (ref 6.1–8.1)
Total Bilirubin: 0.4 mg/dL (ref 0.2–1.2)

## 2015-08-13 LAB — TSH: TSH: 1.46 m[IU]/L

## 2015-08-13 MED ORDER — PHENTERMINE HCL 37.5 MG PO TABS: 37.5000 mg | ORAL_TABLET | Freq: Every day | ORAL | Status: DC

## 2015-08-13 NOTE — Progress Notes (Signed)
Pre visit review using our clinic review tool, if applicable. No additional management support is needed unless otherwise documented below in the visit note. 

## 2015-08-13 NOTE — Progress Notes (Signed)
Subjective:     Helen Miles is a 45 y.o. female and is here for a comprehensive physical exam. The patient reports no problems.  Social History   Social History  . Marital Status: Married    Spouse Name: N/A  . Number of Children: N/A  . Years of Education: N/A   Occupational History  . Not on file.   Social History Main Topics  . Smoking status: Never Smoker   . Smokeless tobacco: Not on file  . Alcohol Use: Yes     Comment: RARE, 1 glass wine/ month  . Drug Use: No  . Sexual Activity: Yes    Birth Control/ Protection: Surgical   Other Topics Concern  . Not on file   Social History Narrative   Health Maintenance  Topic Date Due  . HIV Screening  08/12/2016 (Originally 06/15/1985)  . INFLUENZA VACCINE  09/14/2015  . TETANUS/TDAP  12/05/2016  . PAP SMEAR  10/25/2017    The following portions of the patient's history were reviewed and updated as appropriate:  She  has a past medical history of Hyperlipidemia and Generalized headaches. She  does not have any pertinent problems on file. She  has past surgical history that includes Cesarean section and Wisdom tooth extraction. Her family history includes Deep vein thrombosis in her maternal grandfather; Heart attack (age of onset: 4755) in her father; Hypertension in her maternal grandfather; Hypothyroidism in her other; Lung cancer in her maternal grandfather; Lupus in her brother and mother; Pulmonary embolism in her other. There is no history of Diabetes or Stroke. She  reports that she has never smoked. She does not have any smokeless tobacco history on file. She reports that she drinks alcohol. She reports that she does not use illicit drugs. She currently has no medications in their medication list. No current outpatient prescriptions on file prior to visit.   No current facility-administered medications on file prior to visit.   She is allergic to penicillins and contrave..  Review of Systems Review of  Systems  Constitutional: Negative for activity change, appetite change and fatigue.  HENT: Negative for hearing loss, congestion, tinnitus and ear discharge.  dentist [id number] Eyes: Negative for visual disturbance (see optho q1y -- vision corrected to 20/20 with glasses).  Respiratory: Negative for cough, chest tightness and shortness of breath.   Cardiovascular: Negative for chest pain, palpitations and leg swelling.  Gastrointestinal: Negative for abdominal pain, diarrhea, constipation and abdominal distention.  Genitourinary: Negative for urgency, frequency, decreased urine volume and difficulty urinating.  Musculoskeletal: Negative for back pain, arthralgias and gait problem.  Skin: Negative for color change, pallor and rash.  Neurological: Negative for dizziness, light-headedness, numbness and headaches.  Hematological: Negative for adenopathy. Does not bruise/bleed easily.  Psychiatric/Behavioral: Negative for suicidal ideas, confusion, sleep disturbance, self-injury, dysphoric mood, decreased concentration and agitation.       Objective:    BP 128/82 mmHg  Pulse 67  Temp(Src) 98.3 F (36.8 C) (Oral)  Ht 5\' 2"  (1.575 m)  Wt 245 lb (111.131 kg)  BMI 44.80 kg/m2  SpO2 98%  LMP 08/06/2015 General appearance: alert, cooperative, appears stated age and no distress Head: Normocephalic, without obvious abnormality, atraumatic Eyes: conjunctivae/corneas clear. PERRL, EOM's intact. Fundi benign. Ears: normal TM's and external ear canals both ears Nose: Nares normal. Septum midline. Mucosa normal. No drainage or sinus tenderness. Throat: lips, mucosa, and tongue normal; teeth and gums normal Neck: no adenopathy, no carotid bruit, no JVD, supple, symmetrical, trachea midline and  thyroid not enlarged, symmetric, no tenderness/mass/nodules Back: symmetric, no curvature. ROM normal. No CVA tenderness. Lungs: clear to auscultation bilaterally Breasts: normal appearance, no masses or  tenderness Heart: regular rate and rhythm, S1, S2 normal, no murmur, click, rub or gallop Abdomen: soft, non-tender; bowel sounds normal; no masses,  no organomegaly Pelvic: not indicated; post-menopausal, no abnormal Pap smears in past Extremities: extremities normal, atraumatic, no cyanosis or edema Pulses: 2+ and symmetric Skin: Skin color, texture, turgor normal. No rashes or lesions Lymph nodes: Cervical, supraclavicular, and axillary nodes normal. Neurologic: Alert and oriented X 3, normal strength and tone. Normal symmetric reflexes. Normal coordination and gait    Assessment:    Healthy female exam.      Plan:     ghm utd Check labs See After Visit Summary for Counseling Recommendations    1. Preventative health care  See above - POCT urinalysis dipstick - Comprehensive metabolic panel - CBC w/Diff - Lipid Profile - TSH  2. Morbid obesity due to excess calories (HCC) con't diet and exercise - phentermine (ADIPEX-P) 37.5 MG tablet; Take 1 tablet (37.5 mg total) by mouth daily before breakfast.  Dispense: 30 tablet; Refill: 0 - Comprehensive metabolic panel - CBC w/Diff - Lipid Profile - TSH

## 2015-08-13 NOTE — Patient Instructions (Signed)
Preventive Care for Adults, Female A healthy lifestyle and preventive care can promote health and wellness. Preventive health guidelines for women include the following key practices.  A routine yearly physical is a good way to check with your health care provider about your health and preventive screening. It is a chance to share any concerns and updates on your health and to receive a thorough exam.  Visit your dentist for a routine exam and preventive care every 6 months. Brush your teeth twice a day and floss once a day. Good oral hygiene prevents tooth decay and gum disease.  The frequency of eye exams is based on your age, health, family medical history, use of contact lenses, and other factors. Follow your health care provider's recommendations for frequency of eye exams.  Eat a healthy diet. Foods like vegetables, fruits, whole grains, low-fat dairy products, and lean protein foods contain the nutrients you need without too many calories. Decrease your intake of foods high in solid fats, added sugars, and salt. Eat the right amount of calories for you.Get information about a proper diet from your health care provider, if necessary.  Regular physical exercise is one of the most important things you can do for your health. Most adults should get at least 150 minutes of moderate-intensity exercise (any activity that increases your heart rate and causes you to sweat) each week. In addition, most adults need muscle-strengthening exercises on 2 or more days a week.  Maintain a healthy weight. The body mass index (BMI) is a screening tool to identify possible weight problems. It provides an estimate of body fat based on height and weight. Your health care provider can find your BMI and can help you achieve or maintain a healthy weight.For adults 20 years and older:  A BMI below 18.5 is considered underweight.  A BMI of 18.5 to 24.9 is normal.  A BMI of 25 to 29.9 is considered overweight.  A  BMI of 30 and above is considered obese.  Maintain normal blood lipids and cholesterol levels by exercising and minimizing your intake of saturated fat. Eat a balanced diet with plenty of fruit and vegetables. Blood tests for lipids and cholesterol should begin at age 45 and be repeated every 5 years. If your lipid or cholesterol levels are high, you are over 50, or you are at high risk for heart disease, you may need your cholesterol levels checked more frequently.Ongoing high lipid and cholesterol levels should be treated with medicines if diet and exercise are not working.  If you smoke, find out from your health care provider how to quit. If you do not use tobacco, do not start.  Lung cancer screening is recommended for adults aged 45-80 years who are at high risk for developing lung cancer because of a history of smoking. A yearly low-dose CT scan of the lungs is recommended for people who have at least a 30-pack-year history of smoking and are a current smoker or have quit within the past 15 years. A pack year of smoking is smoking an average of 1 pack of cigarettes a day for 1 year (for example: 1 pack a day for 30 years or 2 packs a day for 15 years). Yearly screening should continue until the smoker has stopped smoking for at least 15 years. Yearly screening should be stopped for people who develop a health problem that would prevent them from having lung cancer treatment.  If you are pregnant, do not drink alcohol. If you are  breastfeeding, be very cautious about drinking alcohol. If you are not pregnant and choose to drink alcohol, do not have more than 1 drink per day. One drink is considered to be 12 ounces (355 mL) of beer, 5 ounces (148 mL) of wine, or 1.5 ounces (44 mL) of liquor.  Avoid use of street drugs. Do not share needles with anyone. Ask for help if you need support or instructions about stopping the use of drugs.  High blood pressure causes heart disease and increases the risk  of stroke. Your blood pressure should be checked at least every 1 to 2 years. Ongoing high blood pressure should be treated with medicines if weight loss and exercise do not work.  If you are 55-79 years old, ask your health care provider if you should take aspirin to prevent strokes.  Diabetes screening is done by taking a blood sample to check your blood glucose level after you have not eaten for a certain period of time (fasting). If you are not overweight and you do not have risk factors for diabetes, you should be screened once every 3 years starting at age 45. If you are overweight or obese and you are 40-70 years of age, you should be screened for diabetes every year as part of your cardiovascular risk assessment.  Breast cancer screening is essential preventive care for women. You should practice "breast self-awareness." This means understanding the normal appearance and feel of your breasts and may include breast self-examination. Any changes detected, no matter how small, should be reported to a health care provider. Women in their 20s and 30s should have a clinical breast exam (CBE) by a health care provider as part of a regular health exam every 1 to 3 years. After age 40, women should have a CBE every year. Starting at age 40, women should consider having a mammogram (breast X-ray test) every year. Women who have a family history of breast cancer should talk to their health care provider about genetic screening. Women at a high risk of breast cancer should talk to their health care providers about having an MRI and a mammogram every year.  Breast cancer gene (BRCA)-related cancer risk assessment is recommended for women who have family members with BRCA-related cancers. BRCA-related cancers include breast, ovarian, tubal, and peritoneal cancers. Having family members with these cancers may be associated with an increased risk for harmful changes (mutations) in the breast cancer genes BRCA1 and  BRCA2. Results of the assessment will determine the need for genetic counseling and BRCA1 and BRCA2 testing.  Your health care provider may recommend that you be screened regularly for cancer of the pelvic organs (ovaries, uterus, and vagina). This screening involves a pelvic examination, including checking for microscopic changes to the surface of your cervix (Pap test). You may be encouraged to have this screening done every 3 years, beginning at age 21.  For women ages 30-65, health care providers may recommend pelvic exams and Pap testing every 3 years, or they may recommend the Pap and pelvic exam, combined with testing for human papilloma virus (HPV), every 5 years. Some types of HPV increase your risk of cervical cancer. Testing for HPV may also be done on women of any age with unclear Pap test results.  Other health care providers may not recommend any screening for nonpregnant women who are considered low risk for pelvic cancer and who do not have symptoms. Ask your health care provider if a screening pelvic exam is right for   you.  If you have had past treatment for cervical cancer or a condition that could lead to cancer, you need Pap tests and screening for cancer for at least 20 years after your treatment. If Pap tests have been discontinued, your risk factors (such as having a new sexual partner) need to be reassessed to determine if screening should resume. Some women have medical problems that increase the chance of getting cervical cancer. In these cases, your health care provider may recommend more frequent screening and Pap tests.  Colorectal cancer can be detected and often prevented. Most routine colorectal cancer screening begins at the age of 50 years and continues through age 75 years. However, your health care provider may recommend screening at an earlier age if you have risk factors for colon cancer. On a yearly basis, your health care provider may provide home test kits to check  for hidden blood in the stool. Use of a small camera at the end of a tube, to directly examine the colon (sigmoidoscopy or colonoscopy), can detect the earliest forms of colorectal cancer. Talk to your health care provider about this at age 50, when routine screening begins. Direct exam of the colon should be repeated every 5-10 years through age 75 years, unless early forms of precancerous polyps or small growths are found.  People who are at an increased risk for hepatitis B should be screened for this virus. You are considered at high risk for hepatitis B if:  You were born in a country where hepatitis B occurs often. Talk with your health care provider about which countries are considered high risk.  Your parents were born in a high-risk country and you have not received a shot to protect against hepatitis B (hepatitis B vaccine).  You have HIV or AIDS.  You use needles to inject street drugs.  You live with, or have sex with, someone who has hepatitis B.  You get hemodialysis treatment.  You take certain medicines for conditions like cancer, organ transplantation, and autoimmune conditions.  Hepatitis C blood testing is recommended for all people born from 1945 through 1965 and any individual with known risks for hepatitis C.  Practice safe sex. Use condoms and avoid high-risk sexual practices to reduce the spread of sexually transmitted infections (STIs). STIs include gonorrhea, chlamydia, syphilis, trichomonas, herpes, HPV, and human immunodeficiency virus (HIV). Herpes, HIV, and HPV are viral illnesses that have no cure. They can result in disability, cancer, and death.  You should be screened for sexually transmitted illnesses (STIs) including gonorrhea and chlamydia if:  You are sexually active and are younger than 24 years.  You are older than 24 years and your health care provider tells you that you are at risk for this type of infection.  Your sexual activity has changed  since you were last screened and you are at an increased risk for chlamydia or gonorrhea. Ask your health care provider if you are at risk.  If you are at risk of being infected with HIV, it is recommended that you take a prescription medicine daily to prevent HIV infection. This is called preexposure prophylaxis (PrEP). You are considered at risk if:  You are sexually active and do not regularly use condoms or know the HIV status of your partner(s).  You take drugs by injection.  You are sexually active with a partner who has HIV.  Talk with your health care provider about whether you are at high risk of being infected with HIV. If   you choose to begin PrEP, you should first be tested for HIV. You should then be tested every 3 months for as long as you are taking PrEP.  Osteoporosis is a disease in which the bones lose minerals and strength with aging. This can result in serious bone fractures or breaks. The risk of osteoporosis can be identified using a bone density scan. Women ages 67 years and over and women at risk for fractures or osteoporosis should discuss screening with their health care providers. Ask your health care provider whether you should take a calcium supplement or vitamin D to reduce the rate of osteoporosis.  Menopause can be associated with physical symptoms and risks. Hormone replacement therapy is available to decrease symptoms and risks. You should talk to your health care provider about whether hormone replacement therapy is right for you.  Use sunscreen. Apply sunscreen liberally and repeatedly throughout the day. You should seek shade when your shadow is shorter than you. Protect yourself by wearing long sleeves, pants, a wide-brimmed hat, and sunglasses year round, whenever you are outdoors.  Once a month, do a whole body skin exam, using a mirror to look at the skin on your back. Tell your health care provider of new moles, moles that have irregular borders, moles that  are larger than a pencil eraser, or moles that have changed in shape or color.  Stay current with required vaccines (immunizations).  Influenza vaccine. All adults should be immunized every year.  Tetanus, diphtheria, and acellular pertussis (Td, Tdap) vaccine. Pregnant women should receive 1 dose of Tdap vaccine during each pregnancy. The dose should be obtained regardless of the length of time since the last dose. Immunization is preferred during the 27th-36th week of gestation. An adult who has not previously received Tdap or who does not know her vaccine status should receive 1 dose of Tdap. This initial dose should be followed by tetanus and diphtheria toxoids (Td) booster doses every 10 years. Adults with an unknown or incomplete history of completing a 3-dose immunization series with Td-containing vaccines should begin or complete a primary immunization series including a Tdap dose. Adults should receive a Td booster every 10 years.  Varicella vaccine. An adult without evidence of immunity to varicella should receive 2 doses or a second dose if she has previously received 1 dose. Pregnant females who do not have evidence of immunity should receive the first dose after pregnancy. This first dose should be obtained before leaving the health care facility. The second dose should be obtained 4-8 weeks after the first dose.  Human papillomavirus (HPV) vaccine. Females aged 13-26 years who have not received the vaccine previously should obtain the 3-dose series. The vaccine is not recommended for use in pregnant females. However, pregnancy testing is not needed before receiving a dose. If a female is found to be pregnant after receiving a dose, no treatment is needed. In that case, the remaining doses should be delayed until after the pregnancy. Immunization is recommended for any person with an immunocompromised condition through the age of 61 years if she did not get any or all doses earlier. During the  3-dose series, the second dose should be obtained 4-8 weeks after the first dose. The third dose should be obtained 24 weeks after the first dose and 16 weeks after the second dose.  Zoster vaccine. One dose is recommended for adults aged 30 years or older unless certain conditions are present.  Measles, mumps, and rubella (MMR) vaccine. Adults born  before 1957 generally are considered immune to measles and mumps. Adults born in 1957 or later should have 1 or more doses of MMR vaccine unless there is a contraindication to the vaccine or there is laboratory evidence of immunity to each of the three diseases. A routine second dose of MMR vaccine should be obtained at least 28 days after the first dose for students attending postsecondary schools, health care workers, or international travelers. People who received inactivated measles vaccine or an unknown type of measles vaccine during 1963-1967 should receive 2 doses of MMR vaccine. People who received inactivated mumps vaccine or an unknown type of mumps vaccine before 1979 and are at high risk for mumps infection should consider immunization with 2 doses of MMR vaccine. For females of childbearing age, rubella immunity should be determined. If there is no evidence of immunity, females who are not pregnant should be vaccinated. If there is no evidence of immunity, females who are pregnant should delay immunization until after pregnancy. Unvaccinated health care workers born before 1957 who lack laboratory evidence of measles, mumps, or rubella immunity or laboratory confirmation of disease should consider measles and mumps immunization with 2 doses of MMR vaccine or rubella immunization with 1 dose of MMR vaccine.  Pneumococcal 13-valent conjugate (PCV13) vaccine. When indicated, a person who is uncertain of his immunization history and has no record of immunization should receive the PCV13 vaccine. All adults 65 years of age and older should receive this  vaccine. An adult aged 19 years or older who has certain medical conditions and has not been previously immunized should receive 1 dose of PCV13 vaccine. This PCV13 should be followed with a dose of pneumococcal polysaccharide (PPSV23) vaccine. Adults who are at high risk for pneumococcal disease should obtain the PPSV23 vaccine at least 8 weeks after the dose of PCV13 vaccine. Adults older than 44 years of age who have normal immune system function should obtain the PPSV23 vaccine dose at least 1 year after the dose of PCV13 vaccine.  Pneumococcal polysaccharide (PPSV23) vaccine. When PCV13 is also indicated, PCV13 should be obtained first. All adults aged 65 years and older should be immunized. An adult younger than age 65 years who has certain medical conditions should be immunized. Any person who resides in a nursing home or long-term care facility should be immunized. An adult smoker should be immunized. People with an immunocompromised condition and certain other conditions should receive both PCV13 and PPSV23 vaccines. People with human immunodeficiency virus (HIV) infection should be immunized as soon as possible after diagnosis. Immunization during chemotherapy or radiation therapy should be avoided. Routine use of PPSV23 vaccine is not recommended for American Indians, [place] Natives, or people younger than 65 years unless there are medical conditions that require PPSV23 vaccine. When indicated, people who have unknown immunization and have no record of immunization should receive PPSV23 vaccine. One-time revaccination 5 years after the first dose of PPSV23 is recommended for people aged 19-64 years who have chronic kidney failure, nephrotic syndrome, asplenia, or immunocompromised conditions. People who received 1-2 doses of PPSV23 before age 65 years should receive another dose of PPSV23 vaccine at age 65 years or later if at least 5 years have passed since the previous dose. Doses of PPSV23 are not  needed for people immunized with PPSV23 at or after age 65 years.  Meningococcal vaccine. Adults with asplenia or persistent complement component deficiencies should receive 2 doses of quadrivalent meningococcal conjugate (MenACWY-D) vaccine. The doses should be obtained   at least 2 months apart. Microbiologists working with certain meningococcal bacteria, [place] recruits, people at risk during an outbreak, and people who travel to or live in countries with a high rate of meningitis should be immunized. A first-year college student up through age 34 years who is living in a residence hall should receive a dose if she did not receive a dose on or after her 16th birthday. Adults who have certain high-risk conditions should receive one or more doses of vaccine.  Hepatitis A vaccine. Adults who wish to be protected from this disease, have certain high-risk conditions, work with hepatitis A-infected animals, work in hepatitis A research labs, or travel to or work in countries with a high rate of hepatitis A should be immunized. Adults who were previously unvaccinated and who anticipate close contact with an international adoptee during the first 60 days after arrival in the [place] from a country with a high rate of hepatitis A should be immunized.  Hepatitis B vaccine. Adults who wish to be protected from this disease, have certain high-risk conditions, may be exposed to blood or other infectious body fluids, are household contacts or sex partners of hepatitis B positive people, are clients or workers in certain care facilities, or travel to or work in countries with a high rate of hepatitis B should be immunized.  Haemophilus influenzae type b (Hib) vaccine. A previously unvaccinated person with asplenia or sickle cell disease or having a scheduled splenectomy should receive 1 dose of Hib vaccine. Regardless of previous immunization, a recipient of a hematopoietic stem cell transplant should receive a  3-dose series 6-12 months after her successful transplant. Hib vaccine is not recommended for adults with HIV infection. Preventive Services / Frequency Ages 35 to 4 years  Blood pressure check.** / Every 3-5 years.  Lipid and cholesterol check.** / Every 5 years beginning at age 60.  Clinical breast exam.** / Every 3 years for women in their 71s and 10s.  BRCA-related cancer risk assessment.** / For women who have family members with a BRCA-related cancer (breast, ovarian, tubal, or peritoneal cancers).  Pap test.** / Every 2 years from ages 76 through 26. Every 3 years starting at age 61 through age 76 or 93 with a history of 3 consecutive normal Pap tests.  HPV screening.** / Every 3 years from ages 37 through ages 60 to 51 with a history of 3 consecutive normal Pap tests.  Hepatitis C blood test.** / For any individual with known risks for hepatitis C.  Skin self-exam. / Monthly.  Influenza vaccine. / Every year.  Tetanus, diphtheria, and acellular pertussis (Tdap, Td) vaccine.** / Consult your health care provider. Pregnant women should receive 1 dose of Tdap vaccine during each pregnancy. 1 dose of Td every 10 years.  Varicella vaccine.** / Consult your health care provider. Pregnant females who do not have evidence of immunity should receive the first dose after pregnancy.  HPV vaccine. / 3 doses over 6 months, if 93 and younger. The vaccine is not recommended for use in pregnant females. However, pregnancy testing is not needed before receiving a dose.  Measles, mumps, rubella (MMR) vaccine.** / You need at least 1 dose of MMR if you were born in 1957 or later. You may also need a 2nd dose. For females of childbearing age, rubella immunity should be determined. If there is no evidence of immunity, females who are not pregnant should be vaccinated. If there is no evidence of immunity, females who are  pregnant should delay immunization until after pregnancy.  Pneumococcal  13-valent conjugate (PCV13) vaccine.** / Consult your health care provider.  Pneumococcal polysaccharide (PPSV23) vaccine.** / 1 to 2 doses if you smoke cigarettes or if you have certain conditions.  Meningococcal vaccine.** / 1 dose if you are age 68 to 8 years and a Market researcher living in a residence hall, or have one of several medical conditions, you need to get vaccinated against meningococcal disease. You may also need additional booster doses.  Hepatitis A vaccine.** / Consult your health care provider.  Hepatitis B vaccine.** / Consult your health care provider.  Haemophilus influenzae type b (Hib) vaccine.** / Consult your health care provider. Ages 7 to 53 years  Blood pressure check.** / Every year.  Lipid and cholesterol check.** / Every 5 years beginning at age 25 years.  Lung cancer screening. / Every year if you are aged 11-80 years and have a 30-pack-year history of smoking and currently smoke or have quit within the past 15 years. Yearly screening is stopped once you have quit smoking for at least 15 years or develop a health problem that would prevent you from having lung cancer treatment.  Clinical breast exam.** / Every year after age 48 years.  BRCA-related cancer risk assessment.** / For women who have family members with a BRCA-related cancer (breast, ovarian, tubal, or peritoneal cancers).  Mammogram.** / Every year beginning at age 41 years and continuing for as long as you are in good health. Consult with your health care provider.  Pap test.** / Every 3 years starting at age 65 years through age 37 or 70 years with a history of 3 consecutive normal Pap tests.  HPV screening.** / Every 3 years from ages 72 years through ages 60 to 40 years with a history of 3 consecutive normal Pap tests.  Fecal occult blood test (FOBT) of stool. / Every year beginning at age 21 years and continuing until age 5 years. You may not need to do this test if you get  a colonoscopy every 10 years.  Flexible sigmoidoscopy or colonoscopy.** / Every 5 years for a flexible sigmoidoscopy or every 10 years for a colonoscopy beginning at age 35 years and continuing until age 48 years.  Hepatitis C blood test.** / For all people born from [birth year] through 1965 and any individual with known risks for hepatitis C.  Skin self-exam. / Monthly.  Influenza vaccine. / Every year.  Tetanus, diphtheria, and acellular pertussis (Tdap/Td) vaccine.** / Consult your health care provider. Pregnant women should receive 1 dose of Tdap vaccine during each pregnancy. 1 dose of Td every 10 years.  Varicella vaccine.** / Consult your health care provider. Pregnant females who do not have evidence of immunity should receive the first dose after pregnancy.  Zoster vaccine.** / 1 dose for adults aged 30 years or older.  Measles, mumps, rubella (MMR) vaccine.** / You need at least 1 dose of MMR if you were born in 1957 or later. You may also need a second dose. For females of childbearing age, rubella immunity should be determined. If there is no evidence of immunity, females who are not pregnant should be vaccinated. If there is no evidence of immunity, females who are pregnant should delay immunization until after pregnancy.  Pneumococcal 13-valent conjugate (PCV13) vaccine.** / Consult your health care provider.  Pneumococcal polysaccharide (PPSV23) vaccine.** / 1 to 2 doses if you smoke cigarettes or if you have certain conditions.  Meningococcal vaccine.** /  Consult your health care provider.  Hepatitis A vaccine.** / Consult your health care provider.  Hepatitis B vaccine.** / Consult your health care provider.  Haemophilus influenzae type b (Hib) vaccine.** / Consult your health care provider. Ages 64 years and over  Blood pressure check.** / Every year.  Lipid and cholesterol check.** / Every 5 years beginning at age 23 years.  Lung cancer screening. / Every year if you  are aged 16-80 years and have a 30-pack-year history of smoking and currently smoke or have quit within the past 15 years. Yearly screening is stopped once you have quit smoking for at least 15 years or develop a health problem that would prevent you from having lung cancer treatment.  Clinical breast exam.** / Every year after age 74 years.  BRCA-related cancer risk assessment.** / For women who have family members with a BRCA-related cancer (breast, ovarian, tubal, or peritoneal cancers).  Mammogram.** / Every year beginning at age 44 years and continuing for as long as you are in good health. Consult with your health care provider.  Pap test.** / Every 3 years starting at age 58 years through age 22 or 39 years with 3 consecutive normal Pap tests. Testing can be stopped between 65 and 70 years with 3 consecutive normal Pap tests and no abnormal Pap or HPV tests in the past 10 years.  HPV screening.** / Every 3 years from ages 64 years through ages 70 or 61 years with a history of 3 consecutive normal Pap tests. Testing can be stopped between 65 and 70 years with 3 consecutive normal Pap tests and no abnormal Pap or HPV tests in the past 10 years.  Fecal occult blood test (FOBT) of stool. / Every year beginning at age 40 years and continuing until age 27 years. You may not need to do this test if you get a colonoscopy every 10 years.  Flexible sigmoidoscopy or colonoscopy.** / Every 5 years for a flexible sigmoidoscopy or every 10 years for a colonoscopy beginning at age 7 years and continuing until age 32 years.  Hepatitis C blood test.** / For all people born from [birth year] through 1964 and any individual with known risks for hepatitis C.  Osteoporosis screening.** / A one-time screening for women ages 30 years and over and women at risk for fractures or osteoporosis.  Skin self-exam. / Monthly.  Influenza vaccine. / Every year.  Tetanus, diphtheria, and acellular pertussis (Tdap/Td)  vaccine.** / 1 dose of Td every 10 years.  Varicella vaccine.** / Consult your health care provider.  Zoster vaccine.** / 1 dose for adults aged 35 years or older.  Pneumococcal 13-valent conjugate (PCV13) vaccine.** / Consult your health care provider.  Pneumococcal polysaccharide (PPSV23) vaccine.** / 1 dose for all adults aged 46 years and older.  Meningococcal vaccine.** / Consult your health care provider.  Hepatitis A vaccine.** / Consult your health care provider.  Hepatitis B vaccine.** / Consult your health care provider.  Haemophilus influenzae type b (Hib) vaccine.** / Consult your health care provider. ** Family history and personal history of risk and conditions may change your health care provider's recommendations.   This information is not intended to replace advice given to you by your health care provider. Make sure you discuss any questions you have with your health care provider.   Document Released: 03/28/2001 Document Revised: 02/20/2014 Document Reviewed: 06/27/2010 Elsevier Interactive Patient Education [employer].

## 2015-08-14 ENCOUNTER — Encounter: Payer: Self-pay | Admitting: Family Medicine

## 2015-09-23 ENCOUNTER — Telehealth: Payer: Self-pay | Admitting: Family Medicine

## 2015-09-23 NOTE — Telephone Encounter (Signed)
Discussed with patient and the insurance company will not cover the Saxenda, she wanted to know if she could get a refill on the phentermine. Please advise    KP

## 2015-09-23 NOTE — Telephone Encounter (Signed)
°  Relationship to patient: Self Can be reached: [phone]   Reason for call: Patient request call back to discuss medication that PCP would like for her to try.

## 2015-09-24 MED ORDER — PHENTERMINE HCL 37.5 MG PO TABS: 37.5000 mg | ORAL_TABLET | Freq: Every day | ORAL | 0 refills | Status: DC

## 2015-09-24 NOTE — Telephone Encounter (Signed)
Patient aware, she is in a conference and unable to talk. She will follow up. Rx faxed to preferred pharmacy.    KP

## 2015-09-24 NOTE — Telephone Encounter (Signed)
adipex 37.5  #30  1 po qd  0 refills Ov in 1 month

## 2015-12-10 ENCOUNTER — Telehealth: Payer: Self-pay | Admitting: Family Medicine

## 2015-12-10 ENCOUNTER — Ambulatory Visit: Payer: Managed Care, Other (non HMO) | Admitting: Family Medicine

## 2015-12-10 NOTE — Telephone Encounter (Signed)
Patient called stating that her neck, at the base of her shoulder, her shoulder and her back on the left side is hurting. It started last night as she went to bed, she turned the wrong way and it started hurting. She has tried [employer] and that did not help. She is asking if Dr. Laury AxonLowne can call in something to help her or if she can recommend something to help her. She stated she cannot come in today as she has a deadline to meet at work today. Please advise  Patient phone: [phone] [phone] [place] - [phone] [facility] [address]

## 2015-12-10 NOTE — Telephone Encounter (Signed)
Spoke with patient, patient states that she has shoulder and back pain, she report that she think she pulled a muscle. I advise the patient that she would have to be evaluated to receive a prescription for pain. Patient states she understands and made an acute appointment today to see provider.

## 2015-12-20 ENCOUNTER — Other Ambulatory Visit: Payer: Self-pay | Admitting: Obstetrics and Gynecology

## 2015-12-21 LAB — CYTOLOGY - PAP

## 2016-08-05 ENCOUNTER — Emergency Department (HOSPITAL_COMMUNITY)
Admission: EM | Admit: 2016-08-05 | Discharge: 2016-08-05 | Disposition: A | Discharge status: Home / Self Care | Payer: Managed Care, Other (non HMO) | Attending: Emergency Medicine | Admitting: Emergency Medicine

## 2016-08-05 ENCOUNTER — Emergency Department (HOSPITAL_COMMUNITY): Payer: Managed Care, Other (non HMO)

## 2016-08-05 ENCOUNTER — Encounter (HOSPITAL_COMMUNITY): Payer: Self-pay | Admitting: *Deleted

## 2016-08-05 ENCOUNTER — Ambulatory Visit (HOSPITAL_COMMUNITY)
Admission: EM | Admit: 2016-08-05 | Discharge: 2016-08-05 | Disposition: A | Discharge status: Home / Self Care | Payer: Managed Care, Other (non HMO)

## 2016-08-05 DIAGNOSIS — R9431 Abnormal electrocardiogram [ECG] [EKG]: Secondary | ICD-10-CM | POA: Insufficient documentation

## 2016-08-05 DIAGNOSIS — Z880 Allergy status to penicillin: Secondary | ICD-10-CM | POA: Insufficient documentation

## 2016-08-05 DIAGNOSIS — I493 Ventricular premature depolarization: Secondary | ICD-10-CM | POA: Insufficient documentation

## 2016-08-05 DIAGNOSIS — R002 Palpitations: Secondary | ICD-10-CM | POA: Insufficient documentation

## 2016-08-05 LAB — BASIC METABOLIC PANEL
ANION GAP: 7 (ref 5–15)
BUN: 8 mg/dL (ref 6–20)
CALCIUM: 9.1 mg/dL (ref 8.9–10.3)
CO2: 26 mmol/L (ref 22–32)
CREATININE: 0.96 mg/dL (ref 0.44–1.00)
Chloride: 107 mmol/L (ref 101–111)
Glucose, Bld: 88 mg/dL (ref 65–99)
Potassium: 3.5 mmol/L (ref 3.5–5.1)
SODIUM: 140 mmol/L (ref 135–145)

## 2016-08-05 LAB — I-STAT TROPONIN, ED: TROPONIN I, POC: 0 ng/mL (ref 0.00–0.08)

## 2016-08-05 LAB — CBC
HCT: 41.3 % (ref 36.0–46.0)
Hemoglobin: 13.2 g/dL (ref 12.0–15.0)
MCH: 28.9 pg (ref 26.0–34.0)
MCHC: 32 g/dL (ref 30.0–36.0)
MCV: 90.4 fL (ref 78.0–100.0)
PLATELETS: 215 10*3/uL (ref 150–400)
RBC: 4.57 MIL/uL (ref 3.87–5.11)
RDW: 13 % (ref 11.5–15.5)
WBC: 6.2 10*3/uL (ref 4.0–10.5)

## 2016-08-05 NOTE — ED Triage Notes (Signed)
Pt reports having palpitations and feeling light heart is racing, has been getting progressively worse since last night. Has hx of same but does not need medication for it. Denies any cp or sob. ekg done, hr 73.

## 2016-08-05 NOTE — Discharge Instructions (Signed)
Your symptoms are likely related to premature ventricular contractions (PVCs). Your work up today was reassuring and unremarkable except for the PVCs on your EKG. Avoid caffeine intake, stay very well hydrated. Follow up with the cardiologist in 1-2 weeks for recheck of symptoms and for ongoing evaluation and management of your symptoms. Follow up with your primary care doctor in 1-2 weeks for recheck of symptoms and to check your thyroid studies. Return to the ER for emergent changes or worsening symptoms.

## 2016-08-05 NOTE — ED Provider Notes (Signed)
[facility]EMER[facility]ENCY DEPT Provider Note   CSN: [id number] Arrival date & time: 08/05/16  1614     History   Chief Complaint Chief Complaint  Patient presents with  . Palpitations    HPI Helen Miles is a 46 y.o. female with a PMHx of tachycardia/palpitations, headaches, obesity, HTN, HLD, and hiatal hernia, who presents to the ED with complaints of intermittent palpitations 1 month that worsened since yesterday. She states that years ago she had similar issues, had a stress test and Holter monitoring 1 month that were "normal", and she was told by her PCP to exercise more. She recently stopped taking phentermine about one month ago, and after that she noticed the palpitations started. She describes the palpitations as a sensation that her heart is racing quickly, denies any irregular rhythm or skipped/extra beats, states that it occurs intermittently for about 30 seconds before self resolving, no known aggravating or alleviating factors, no treatments tried. She denies any recent new medications, but states that over the last month she's been under a lot more stress and not exercising as much is normal. She also reports increased caffeine intake, stating that she is having about 3 diet Cokes per day which is more than her usual one per day, however hasn't noticed any association of her symptoms with her diet coke consumption. She states that currently her symptoms are resolved. In the past she had an echo which was normal, and thyroid function studies that have been unremarkable every year at her PCPs office.  She denies any lightheadedness, diaphoresis, fevers, chills, CP, SOB, LE swelling, recent travel/surgery/immobilization, estrogen use, personal/family hx of DVT/PE, abd pain, N/V/D/C, melena, hematochezia, hematuria, dysuria, myalgias, arthralgias, numbness, tingling, focal weakness, unexpected weight loss/gain, or any other complaints at this time. She is a nonsmoker. +FHx of cardiac  disease, CAD in her father.   The history is provided by the patient and medical records. No language interpreter was used.  Palpitations   This is a recurrent problem. The current episode started more than 1 week ago. Episode frequency: intermittent. The problem has been gradually worsening. The problem is associated with caffeine and stress. On average, each episode lasts 1 month. Pertinent negatives include no diaphoresis, no fever, no numbness, no chest pain, no irregular heartbeat, no abdominal pain, no nausea, no vomiting, no lower extremity edema, no dizziness, no weakness and no shortness of breath. She has tried nothing for the symptoms. The treatment provided no relief. Risk factors include stress.    Past Medical History:  Diagnosis Date  . Generalized headaches     Patient Active Problem List   Diagnosis Date Noted  . Headache(784.0) 06/03/2009  . ELEVATED BLOOD PRESSURE WITHOUT DIAGNOSIS OF HYPERTENSION 06/03/2009  . TACHYCARDIA, HX OF 06/03/2009  . Obesity, morbid (HCC) 11/12/2008  . HIATAL HERNIA 10/23/2007  . HYPERLIPIDEMIA 12/06/2006  . PALPITATIONS 12/06/2006    Past Surgical History:  Procedure Laterality Date  . CESAREAN SECTION     G 3 P 2 ( C section X2)  . WISDOM TOOTH EXTRACTION      OB History    No data available       Home Medications    Prior to Admission medications   Medication Sig Start Date End Date Taking? Authorizing Provider  phentermine (ADIPEX-P) 37.5 MG tablet Take 1 tablet (37.5 mg total) by mouth daily before breakfast. 09/24/15   Zola Button, Grayling Congress, DO    Family History Family History  Problem Relation Age of Onset  .  Heart attack Father 4055  . Lupus Mother        dermatologic  . Lupus Brother        dermatologic  . Hypertension Maternal Grandfather   . Lung cancer Maternal Grandfather        smoker  . Deep vein thrombosis Maternal Grandfather   . Pulmonary embolism Other        5 maternal great uncles  .  Hypothyroidism Other        PGGM  . Diabetes Neg Hx   . Stroke Neg Hx     Social History Social History  Substance Use Topics  . Smoking status: Never Smoker  . Smokeless tobacco: Not on file  . Alcohol use Yes     Comment: RARE, 1 glass wine/ month     Allergies   Penicillins and Contrave [naltrexone-bupropion hcl er]   Review of Systems Review of Systems  Constitutional: Negative for chills, diaphoresis, fever and unexpected weight change.  Respiratory: Negative for shortness of breath.   Cardiovascular: Positive for palpitations. Negative for chest pain and leg swelling.  Gastrointestinal: Negative for abdominal pain, blood in stool, constipation, diarrhea, nausea and vomiting.  Genitourinary: Negative for dysuria and hematuria.  Musculoskeletal: Negative for arthralgias and myalgias.  Skin: Negative for color change.  Allergic/Immunologic: Negative for immunocompromised state.  Neurological: Negative for dizziness, weakness, light-headedness and numbness.  Psychiatric/Behavioral: Negative for confusion.   All other systems reviewed and are negative for acute change except as noted in the HPI.    Physical Exam Updated Vital Signs BP 130/86   Pulse 71   Temp 98.5 F (36.9 C) (Oral)   Resp 17   LMP 08/04/2016   SpO2 100%   Physical Exam  Constitutional: She is oriented to person, place, and time. Vital signs are normal. She appears well-developed and well-nourished.  Non-toxic appearance. No distress.  Afebrile, nontoxic, NAD  HENT:  Head: Normocephalic and atraumatic.  Mouth/Throat: Oropharynx is clear and moist and mucous membranes are normal.  Eyes: Conjunctivae and EOM are normal. Right eye exhibits no discharge. Left eye exhibits no discharge.  Neck: Normal range of motion. Neck supple.  Cardiovascular: Normal rate, regular rhythm, normal heart sounds and intact distal pulses.  Exam reveals no gallop and no friction rub.   No murmur heard. RRR, nl s1/s2,  no m/r/g, distal pulses intact, no pedal edema   Pulmonary/Chest: Effort normal and breath sounds normal. No respiratory distress. She has no decreased breath sounds. She has no wheezes. She has no rhonchi. She has no rales.  Abdominal: Soft. Normal appearance and bowel sounds are normal. She exhibits no distension. There is no tenderness. There is no rigidity, no rebound, no guarding, no CVA tenderness, no tenderness at McBurney's point and negative Murphy's sign.  Musculoskeletal: Normal range of motion.  MAE x4 Strength and sensation grossly intact in all extremities Distal pulses intact Gait steady No pedal edema, neg homan's bilaterally   Neurological: She is alert and oriented to person, place, and time. She has normal strength. No sensory deficit.  Skin: Skin is warm, dry and intact. No rash noted.  Psychiatric: She has a normal mood and affect.  Nursing note and vitals reviewed.    ED Treatments / Results  Labs (all labs ordered are listed, but only abnormal results are displayed) Labs Reviewed  BASIC METABOLIC PANEL  CBC  I-STAT TROPOININ, ED   08/13/15 TSH: 1.46 (WNL) 02/17/14 TSH/T4/Free thyroxine index [respectively]: 1.513 (WNL)/7.8 (WNL)/2.3 (WNL)  EKG  EKG Interpretation  Date/Time:  Saturday August 05 2016 16:26:12 EDT Ventricular Rate:  73 PR Interval:  174 QRS Duration: 102 QT Interval:  370 QTC Calculation: 407 R Axis:   -25 Text Interpretation:  Sinus rhythm with occasional Premature ventricular complexes Incomplete right bundle branch block Left ventricular hypertrophy Abnormal ECG Since last EKG, PVCs are now evident Confirmed by Shaune Pollack [phone]) on 08/05/2016 5:48:44 PM       Radiology Dg Chest 2 View  Result Date: 08/05/2016 CLINICAL DATA:  Palpitations EXAM: CHEST  2 VIEW COMPARISON:  Chest CT and chest radiograph 12/10/2010 FINDINGS: The heart size and mediastinal contours are within normal limits. Both lungs are clear. The visualized skeletal  structures are unremarkable. IMPRESSION: No active cardiopulmonary disease. Electronically Signed   By: Deatra Robinson M.D.   On: 08/05/2016 17:45   Echo 02/04/07:  SUMMARY - Overall left ventricular systolic function was normal. There were    no left ventricular regional wall motion abnormalities.   Procedures Procedures (including critical care time)  Medications Ordered in ED Medications - No data to display   Initial Impression / Assessment and Plan / ED Course  I have reviewed the triage vital signs and the nursing notes.  Pertinent labs & imaging results that were available during my care of the patient were reviewed by me and considered in my medical decision making (see chart for details).     46 y.o. female here with c/o intermittent palpitations over the last 1 month, prior hx of same but with normal stress test and holter monitoring, not on any meds for it. Last night symptoms worsened. States it just feels like a racing heart rate, no skipped beats or extra beats, no irregular rhythm. On exam, RRR, no murmurs/gallops/rubs, no pedal edema, no hypoxia, no concerning exam findings. Trop neg, CBC WNL, BMP WNL. CXR neg. EKG with incomplete RBBB and LVH findings which don't seem to have been present on prior EKGs, and PVC seen. PVCs could be the cause of her symptoms. Currently has no ongoing symptoms. Had TSH/thyroid studies done 1yr ago which were normal, and has had multiple checks in the past which were all unremarkable. Echo in 2008 WNL. Doubt need for further emergent work up, advised caffeine avoidance, staying hydrated, and will have her f/up with [facility] in 1-2wks for further work up and evaluation; will also have her f/up with PCP in 1wk for recheck of symptoms and to check her thyroid studies again (but doubt need to do this now). Discussed case with my attending Dr. Erma Heritage who agrees with plan.  I explained the diagnosis and have given explicit precautions to return to the  ER including for any other new or worsening symptoms. The patient understands and accepts the medical plan as it's been dictated and I have answered their questions. Discharge instructions concerning home care and prescriptions have been given. The patient is STABLE and is discharged to home in good condition.    Final Clinical Impressions(s) / ED Diagnoses   Final diagnoses:  Palpitations  PVC (premature ventricular contraction)  Abnormal EKG    New Prescriptions New Prescriptions   No medications on [address] [place], [place] 08/05/16 1909    Shaune Pollack, MD 08/06/16 1220

## 2016-08-06 ENCOUNTER — Other Ambulatory Visit: Payer: Self-pay | Admitting: Family Medicine

## 2016-11-03 ENCOUNTER — Encounter: Payer: Self-pay | Admitting: Family Medicine

## 2016-11-03 ENCOUNTER — Ambulatory Visit (INDEPENDENT_AMBULATORY_CARE_PROVIDER_SITE_OTHER): Payer: Managed Care, Other (non HMO) | Admitting: Family Medicine

## 2016-11-03 VITALS — BP 124/84 | HR 58 | Temp 98.3°F | Ht 62.0 in | Wt 262.6 lb

## 2016-11-03 DIAGNOSIS — N912 Amenorrhea, unspecified: Secondary | ICD-10-CM

## 2016-11-03 DIAGNOSIS — E785 Hyperlipidemia, unspecified: Secondary | ICD-10-CM | POA: Diagnosis not present

## 2016-11-03 DIAGNOSIS — E669 Obesity, unspecified: Secondary | ICD-10-CM

## 2016-11-03 LAB — COMPREHENSIVE METABOLIC PANEL
ALBUMIN: 3.9 g/dL (ref 3.5–5.2)
ALT: 12 U/L (ref 0–35)
AST: 15 U/L (ref 0–37)
Alkaline Phosphatase: 63 U/L (ref 39–117)
BUN: 11 mg/dL (ref 6–23)
CHLORIDE: 104 meq/L (ref 96–112)
CO2: 28 mEq/L (ref 19–32)
Calcium: 9.3 mg/dL (ref 8.4–10.5)
Creatinine, Ser: 0.96 mg/dL (ref 0.40–1.20)
GFR: 80.69 mL/min (ref >60.00)
Glucose, Bld: 91 mg/dL (ref 70–99)
POTASSIUM: 4.1 meq/L (ref 3.5–5.1)
SODIUM: 136 meq/L (ref 135–145)
Total Bilirubin: 0.7 mg/dL (ref 0.2–1.2)
Total Protein: 7.2 g/dL (ref 6.0–8.3)

## 2016-11-03 LAB — CBC WITH DIFFERENTIAL/PLATELET
BASOS PCT: 0.8 % (ref 0.0–3.0)
Basophils Absolute: 0 10*3/uL (ref 0.0–0.1)
EOS PCT: 1.4 % (ref 0.0–5.0)
Eosinophils Absolute: 0.1 10*3/uL (ref 0.0–0.7)
HCT: 40.4 % (ref 36.0–46.0)
HEMOGLOBIN: 13 g/dL (ref 12.0–15.0)
LYMPHS ABS: 1.2 10*3/uL (ref 0.7–4.0)
Lymphocytes Relative: 26 % (ref 12.0–46.0)
MCHC: 32.2 g/dL (ref 30.0–36.0)
MCV: 90.9 fl (ref 78.0–100.0)
MONO ABS: 0.3 10*3/uL (ref 0.1–1.0)
Monocytes Relative: 6.4 % (ref 3.0–12.0)
NEUTROS PCT: 65.4 % (ref 43.0–77.0)
Neutro Abs: 3.1 10*3/uL (ref 1.4–7.7)
Platelets: 198 10*3/uL (ref 150.0–400.0)
RBC: 4.45 Mil/uL (ref 3.87–5.11)
RDW: 13.7 % (ref 11.5–15.5)
WBC: 4.8 10*3/uL (ref 4.0–10.5)

## 2016-11-03 LAB — TSH: TSH: 1.86 u[IU]/mL (ref 0.35–4.50)

## 2016-11-03 LAB — LIPID PANEL
CHOLESTEROL: 187 mg/dL (ref 0–200)
HDL: 64.3 mg/dL (ref >39.00)
LDL Cholesterol: 116 mg/dL — ABNORMAL HIGH (ref 0–99)
NonHDL: 123.17
Total CHOL/HDL Ratio: 3
Triglycerides: 37 mg/dL (ref 0.0–149.0)
VLDL: 7.4 mg/dL (ref 0.0–40.0)

## 2016-11-03 LAB — POCT URINE PREGNANCY: Preg Test, Ur: NEGATIVE

## 2016-11-03 NOTE — Patient Instructions (Signed)

## 2016-11-03 NOTE — Progress Notes (Signed)
Patient ID: Helen Miles, female    DOB: [birth year]  Age: 46 y.o. MRN: [id number]    Subjective:  Subjective  HPI Helen Miles presents for f/u weight.  She is going to bariatric clinic with [facility] and they want to put her on phenteramine but want an ekg.    Review of Systems  Constitutional: Negative for activity change, appetite change, fatigue and unexpected weight change.  Respiratory: Negative for cough and shortness of breath.   Cardiovascular: Negative for chest pain and palpitations.  Psychiatric/Behavioral: Negative for behavioral problems and dysphoric mood. The patient is not nervous/anxious.     History Past Medical History:  Diagnosis Date  . Generalized headaches     She has a past surgical history that includes Cesarean section and Wisdom tooth extraction.   Her family history includes Deep vein thrombosis in her maternal grandfather; Heart attack (age of onset: 78) in her father; Hypertension in her maternal grandfather; Hypothyroidism in her other; Lung cancer in her maternal grandfather; Lupus in her brother and mother; Pulmonary embolism in her other.She reports that she has never smoked. She has never used smokeless tobacco. She reports that she drinks alcohol. She reports that she does not use drugs.  No current outpatient prescriptions on file prior to visit.   No current facility-administered medications on file prior to visit.      Objective:  Objective  Physical Exam  Constitutional: She is oriented to person, place, and time. She appears well-developed and well-nourished.  HENT:  Head: Normocephalic and atraumatic.  Eyes: Conjunctivae and EOM are normal.  Neck: Normal range of motion. Neck supple. No JVD present. Carotid bruit is not present. No thyromegaly present.  Cardiovascular: Normal rate, regular rhythm and normal heart sounds.   No murmur heard. Pulmonary/Chest: Effort normal and breath sounds normal. No respiratory distress. She  has no wheezes. She has no rales. She exhibits no tenderness.  Musculoskeletal: She exhibits no edema.  Neurological: She is alert and oriented to person, place, and time.  Psychiatric: She has a normal mood and affect.  Nursing note and vitals reviewed.  BP 124/84 (BP Location: Right Arm, Patient Position: Sitting, Cuff Size: Large)   Pulse (!) 58   Temp 98.3 F (36.8 C) (Oral)   Ht  (1.575 m)   Wt 262 lb 9.6 oz (119.1 kg)   LMP 08/31/2016   SpO2 98%   BMI 48.03 kg/m  Wt Readings from Last 3 Encounters:  11/03/16 262 lb 9.6 oz (119.1 kg)  08/13/15 245 lb (111.1 kg)  07/17/14 227 lb (103 kg)     Lab Results  Component Value Date   WBC 4.8 11/03/2016   HGB 13.0 11/03/2016   HCT 40.4 11/03/2016   PLT 198.0 11/03/2016   GLUCOSE 91 11/03/2016   CHOL 187 11/03/2016   TRIG 37.0 11/03/2016   HDL 64.30 11/03/2016   LDLCALC 116 (H) 11/03/2016   ALT 12 11/03/2016   AST 15 11/03/2016   NA 136 11/03/2016   K 4.1 11/03/2016   CL 104 11/03/2016   CREATININE 0.96 11/03/2016   BUN 11 11/03/2016   CO2 28 11/03/2016   TSH 1.86 11/03/2016   HGBA1C 5.1 12/06/2011    Dg Chest 2 View  Result Date: 08/05/2016 CLINICAL DATA:  Palpitations EXAM: CHEST  2 VIEW COMPARISON:  Chest CT and chest radiograph 12/10/2010 FINDINGS: The heart size and mediastinal contours are within normal limits. Both lungs are clear. The visualized skeletal structures are unremarkable. IMPRESSION: No active  cardiopulmonary disease. Electronically Signed   By: Deatra Robinson M.D.   On: 08/05/2016 17:45     Assessment & Plan:  Plan  Ms. Miles does not currently have medications on file.  No orders of the defined types were placed in this encounter.   Problem List Items Addressed This Visit      Unprioritized   Obesity, morbid (HCC)    D/w with pt risks of phenteramine -- she understands and will discuss further with bariatric dr       Other Visit Diagnoses    Obesity, unspecified  classification, unspecified obesity type, unspecified whether serious comorbidity present    -  Primary   Relevant Orders   EKG 12-Lead (Completed)   Amenorrhea       Relevant Orders   POCT urine pregnancy (Completed)   TSH (Completed)   Lipid panel (Completed)   CBC with Differential/Platelet (Completed)   Comprehensive metabolic panel (Completed)   Hyperlipidemia LDL goal <100       Relevant Orders   TSH (Completed)   Lipid panel (Completed)   CBC with Differential/Platelet (Completed)   Comprehensive metabolic panel (Completed)    ekg--no change from previous  Follow-up: Return if symptoms worsen or fail to improve.  Donato Schultz, DO

## 2016-11-05 NOTE — Assessment & Plan Note (Signed)
D/w with pt risks of phenteramine -- she understands and will discuss further with bariatric dr

## 2017-02-07 ENCOUNTER — Ambulatory Visit (INDEPENDENT_AMBULATORY_CARE_PROVIDER_SITE_OTHER): Payer: Managed Care, Other (non HMO) | Admitting: Internal Medicine

## 2017-02-07 ENCOUNTER — Encounter: Payer: Self-pay | Admitting: Internal Medicine

## 2017-02-07 VITALS — BP 122/73 | HR 73 | Temp 98.2°F | Resp 14 | Ht 62.0 in | Wt 257.5 lb

## 2017-02-07 DIAGNOSIS — M545 Low back pain, unspecified: Secondary | ICD-10-CM

## 2017-02-07 MED ORDER — CYCLOBENZAPRINE HCL 10 MG PO TABS: 10.0000 mg | ORAL_TABLET | Freq: Two times a day (BID) | ORAL | 0 refills | Status: DC | PRN

## 2017-02-07 NOTE — Progress Notes (Signed)
   Subjective:    Patient ID: Helen Miles, female    DOB: 06/19/1970, 46 y.o.   MRN: [id number]  DOS:  02/07/2017 Type of visit - description : acute Interval history: Symptoms started yesterday with left-sided back pain without radiation. This is the first time she has something like that. Pain is on and off Has tried icy hot OTC with partial relief.   Review of Systems Denies fever chills.  No rash No fall or injury No lower extremity paresthesias   Past Medical History:  Diagnosis Date  . Generalized headaches     Past Surgical History:  Procedure Laterality Date  . CESAREAN SECTION     G 3 P 2 ( C section X2)  . WISDOM TOOTH EXTRACTION      Social History   Socioeconomic History  . Marital status: Married    Spouse name: Not on file  . Number of children: Not on file  . Years of education: Not on file  . Highest education level: Not on file  Social Needs  . Financial resource strain: Not on file  . Food insecurity - worry: Not on file  . Food insecurity - inability: Not on file  . Transportation needs - medical: Not on file  . Transportation needs - non-medical: Not on file  Occupational History  . Not on file  Tobacco Use  . Smoking status: Never Smoker  . Smokeless tobacco: Never Used  Substance and Sexual Activity  . Alcohol use: Yes    Comment: RARE, 1 glass wine/ month  . Drug use: No  . Sexual activity: Yes    Birth control/protection: Surgical  Other Topics Concern  . Not on file  Social History Narrative  . Not on file      Allergies as of 02/07/2017      Reactions   Penicillins    Angioedema of lip & tongue   Contrave [naltrexone-bupropion Hcl Er] Itching      Medication List    as of 02/07/2017  3:22 PM   You have not been prescribed any medications.        Objective:   Physical Exam BP 122/73 (BP Location: Left Arm, Patient Position: Sitting, Cuff Size: Normal)   Pulse 73   Temp 98.2 F (36.8 C) (Oral)   Resp 14    Ht 5\' 2"  (1.575 m)   Wt 257 lb 8 oz (116.8 kg)   SpO2 100%   BMI 47.10 kg/m  General:   Well developed, well nourished . NAD.  HEENT:  Normocephalic . Face symmetric, atraumatic MSK: No TTP at the lower back or at the trochanteric bursas. Skin: Not pale. Not jaundice Neurologic:  alert & oriented X3.  Speech normal, gait appropriate for age and unassisted.  No  antalgic gait however posture was a slightly antalgic when she laid down on the table DTRs and motor symmetric.  Straight leg test negative. Psych--  Cognition and judgment appear intact.  Cooperative with normal attention span and concentration.  Behavior appropriate. No anxious or depressed appearing.      Assessment & Plan:   46 year old lady with history of elevated BP, hyperlipidemia, morbid obesity, palpitations, birth control husband's vasectomy, presents with:  Lumbalgia: As described above, do not suspect anything serious based on symptoms.  Will recommend rest, muscle relaxant, NSAIDs, GI precautions discussed.  Call if not gradually improving.

## 2017-02-07 NOTE — Progress Notes (Signed)
Pre visit review using our clinic review tool, if applicable. No additional management support is needed unless otherwise documented below in the visit note. 

## 2017-02-07 NOTE — Patient Instructions (Addendum)
  Rest.  No heavy lifting  Heating pad or warm cold compresses   IBUPROFEN (Advil or Motrin) 200 mg 2 tablets every 6 hours as needed for pain.  Always take it with food because may cause gastritis and ulcers.  If you notice nausea, stomach pain, change in the color of stools --->  Stop the medicine and let us know  Flexeril twice a day as needed, this is a muscle relaxant, will cause drowsiness  Call if not gradually improving in the next 10 days.

## 2017-03-04 IMAGING — MG MM SCREEN MAMMOGRAM BILATERAL
3 series · 3 of 3 positions shown · non-contrast
Comparison: Previous exam(s).

CLINICAL DATA: Screening.

EXAM:
DIGITAL SCREENING BILATERAL MAMMOGRAM WITH CAD

[R CC]
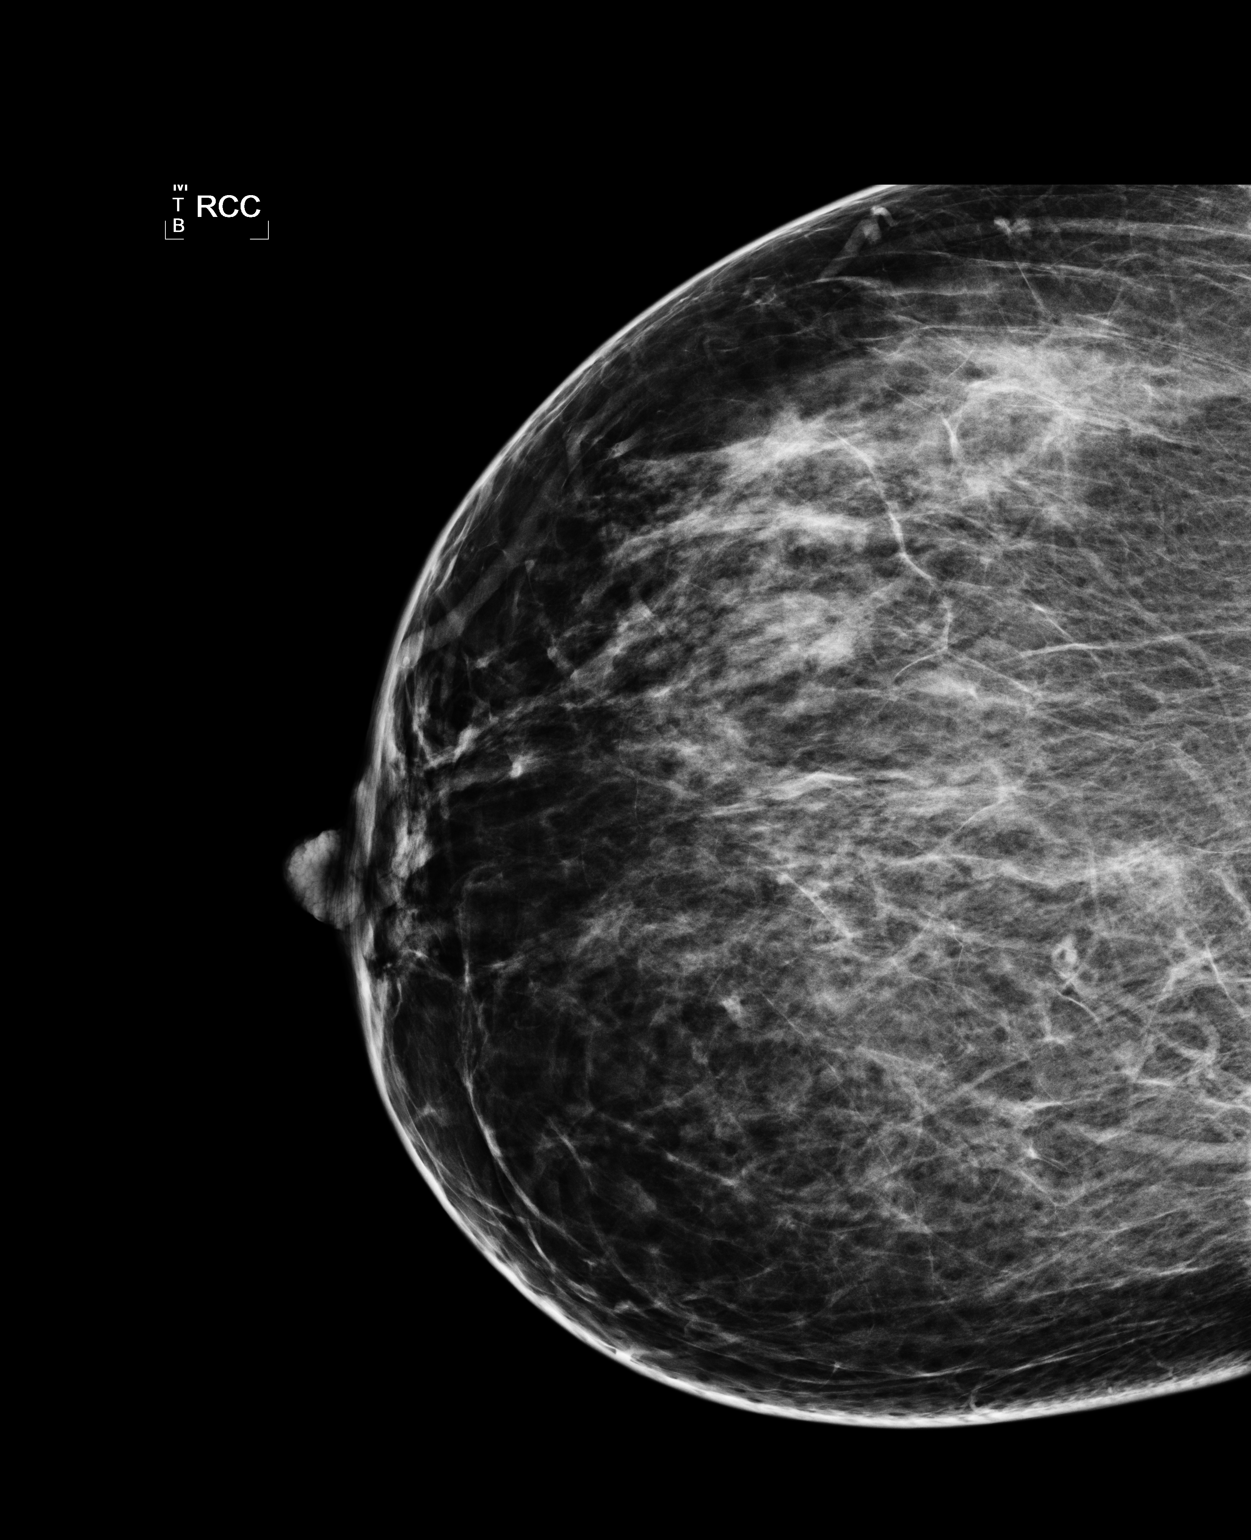

[L CC]
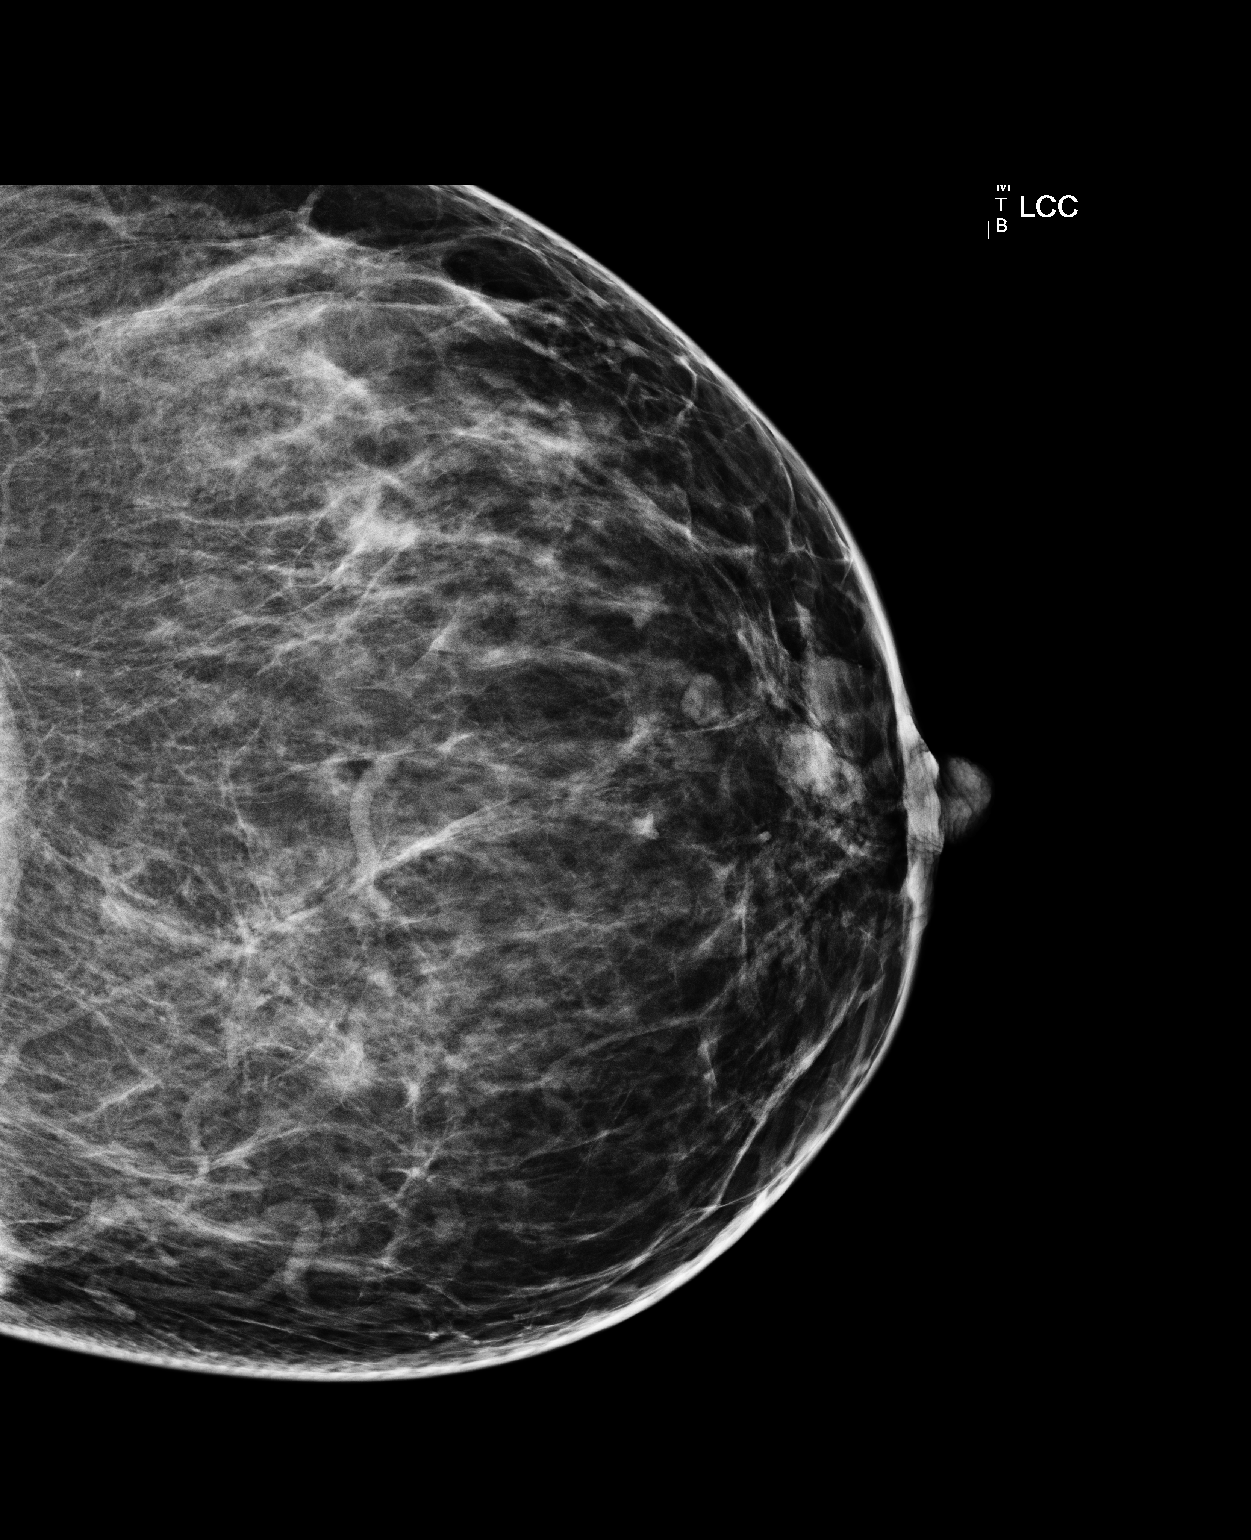

[L MLO]
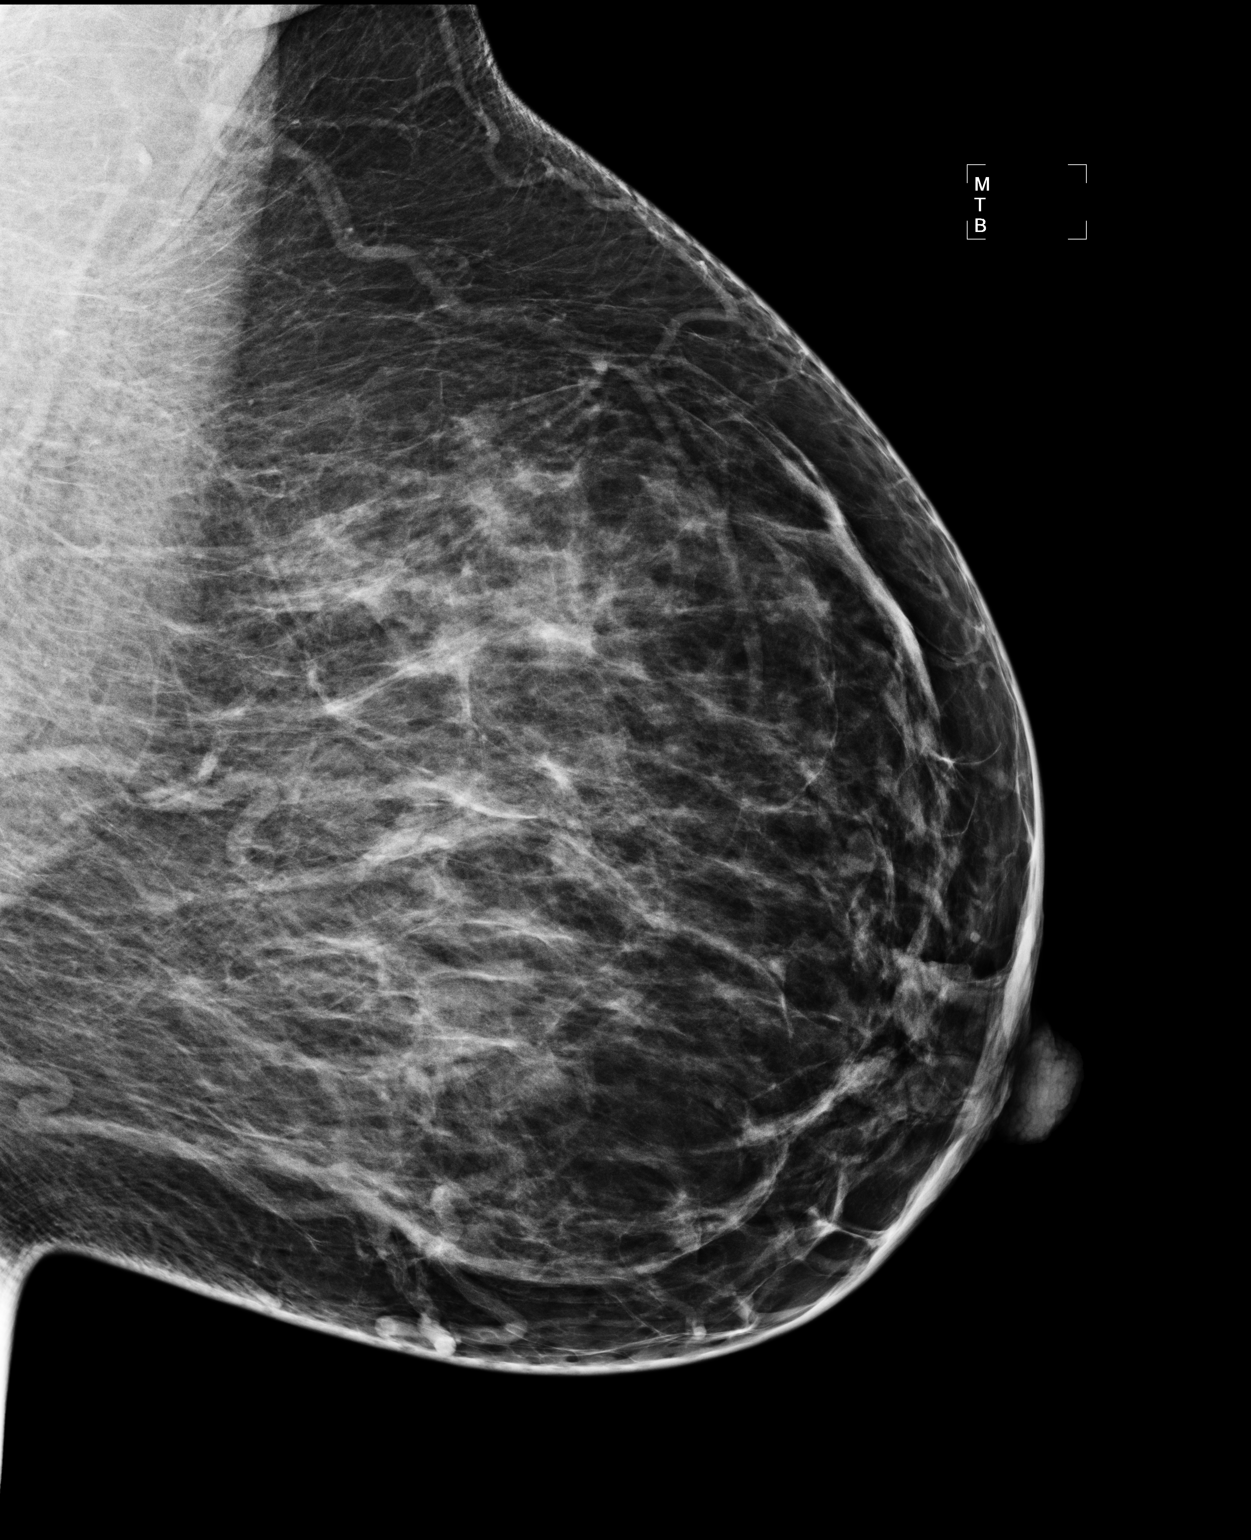

[3 of 3 positions shown; findings below may reference images not displayed]

ACR Breast Density Category c: The breast tissue is heterogeneously
dense, which may obscure small masses.
FINDINGS: In the left breast, a possible mass warrants further evaluation. In
the right breast, no findings suspicious for malignancy.

Images were processed with CAD.
IMPRESSION: Further evaluation is suggested for possible mass in the left
breast.

RECOMMENDATION:
Diagnostic mammogram and possibly ultrasound of the left breast.
(Code:[id number]-[id number])

The patient will be contacted regarding the findings, and additional
imaging will be scheduled.

BI-RADS CATEGORY  0: Incomplete. Need additional imaging evaluation
and/or prior mammograms for comparison.

## 2018-02-20 ENCOUNTER — Encounter: Payer: Self-pay | Admitting: Nurse Practitioner

## 2018-02-20 ENCOUNTER — Ambulatory Visit (INDEPENDENT_AMBULATORY_CARE_PROVIDER_SITE_OTHER): Payer: Managed Care, Other (non HMO) | Admitting: Nurse Practitioner

## 2018-02-20 ENCOUNTER — Ambulatory Visit: Payer: Self-pay

## 2018-02-20 VITALS — BP 120/78 | HR 74 | Temp 98.7°F | Ht 62.0 in | Wt 253.0 lb

## 2018-02-20 DIAGNOSIS — R6889 Other general symptoms and signs: Secondary | ICD-10-CM

## 2018-02-20 LAB — POC INFLUENZA A&B (BINAX/QUICKVUE)
Influenza A, POC: NEGATIVE
Influenza B, POC: NEGATIVE

## 2018-02-20 MED ORDER — GUAIFENESIN-DM 100-10 MG/5ML PO SYRP: 5.0000 mL | ORAL_SOLUTION | ORAL | 0 refills | Status: DC | PRN

## 2018-02-20 MED ORDER — CHLORPHEN-PE-ACETAMINOPHEN 4-10-325 MG PO TABS: 1.0000 | ORAL_TABLET | Freq: Two times a day (BID) | ORAL | 0 refills | Status: AC

## 2018-02-20 MED ORDER — HYDROCODONE-HOMATROPINE 5-1.5 MG/5ML PO SYRP: 5.0000 mL | ORAL_SOLUTION | Freq: Two times a day (BID) | ORAL | 0 refills | Status: DC | PRN

## 2018-02-20 MED ORDER — OSELTAMIVIR PHOSPHATE 75 MG PO CAPS: 75.0000 mg | ORAL_CAPSULE | Freq: Two times a day (BID) | ORAL | 0 refills | Status: DC

## 2018-02-20 NOTE — Patient Instructions (Signed)
Maintain adequate oral hydration  Upper Respiratory Infection, Adult An upper respiratory infection (URI) affects the nose, throat, and upper air passages. URIs are caused by germs (viruses). The most common type of URI is often called "the common cold." Medicines cannot cure URIs, but you can do things at home to relieve your symptoms. URIs usually get better within 7-10 days. Follow these instructions at home: Activity  Rest as needed.  If you have a fever, stay home from work or school until your fever is gone, or until your doctor says you may return to work or school. ? You should stay home until you cannot spread the infection anymore (you are not contagious). ? Your doctor may have you wear a face mask so you have less risk of spreading the infection. Relieving symptoms  Gargle with a salt-water mixture 3-4 times a day or as needed. To make a salt-water mixture, completely dissolve -1 tsp of salt in 1 cup of warm water.  Use a cool-mist humidifier to add moisture to the air. This can help you breathe more easily. Eating and drinking   Drink enough fluid to keep your pee (urine) pale yellow.  Eat soups and other clear broths. General instructions   Take over-the-counter and prescription medicines only as told by your doctor. These include cold medicines, fever reducers, and cough suppressants.  Do not use any products that contain nicotine or tobacco. These include cigarettes and e-cigarettes. If you need help quitting, ask your doctor.  Avoid being where people are smoking (avoid secondhand smoke).  Make sure you get regular shots and get the flu shot every year.  Keep all follow-up visits as told by your doctor. This is important. How to avoid spreading infection to others   Wash your hands often with soap and water. If you do not have soap and water, use hand sanitizer.  Avoid touching your mouth, face, eyes, or nose.  Cough or sneeze into a tissue or your sleeve  or elbow. Do not cough or sneeze into your hand or into the air. Contact a doctor if:  You are getting worse, not better.  You have any of these: ? A fever. ? Chills. ? Brown or red mucus in your nose. ? Yellow or brown fluid (discharge)coming from your nose. ? Pain in your face, especially when you bend forward. ? Swollen neck glands. ? Pain with swallowing. ? White areas in the back of your throat. Get help right away if:  You have shortness of breath that gets worse.  You have very bad or constant: ? Headache. ? Ear pain. ? Pain in your forehead, behind your eyes, and over your cheekbones (sinus pain). ? Chest pain.  You have long-lasting (chronic) lung disease along with any of these: ? Wheezing. ? Long-lasting cough. ? Coughing up blood. ? A change in your usual mucus.  You have a stiff neck.  You have changes in your: ? Vision. ? Hearing. ? Thinking. ? Mood. Summary  An upper respiratory infection (URI) is caused by a germ called a virus. The most common type of URI is often called "the common cold."  URIs usually get better within 7-10 days.  Take over-the-counter and prescription medicines only as told by your doctor. This information is not intended to replace advice given to you by your health care provider. Make sure you discuss any questions you have with your health care provider. Document Released: 07/19/2007 Document Revised: 09/22/2016 Document Reviewed: 09/22/2016 Elsevier Interactive Patient   Patient Education  2019 [employer].

## 2018-02-20 NOTE — Progress Notes (Signed)
Subjective:  Patient ID: Yanelys Joki, female    DOB: [birth year]  Age: 48 y.o. MRN: [id number]  CC: Cough (pt is complaining of coughing yellow mucus,chills,fever 100 and 101,bodyache/2 days/mucinex cold and flu. 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 )  URI   This is a new problem. The current episode started yesterday. The problem has been unchanged. Associated symptoms include chest pain, congestion, coughing, ear pain, a plugged ear sensation, rhinorrhea, sinus pain, sneezing, a sore throat and swollen glands. She has tried acetaminophen, decongestant and increased fluids for the symptoms. The treatment provided mild relief.  exposure to student with influenza.  Reviewed past Medical, Social and Family history today.  Outpatient Medications Prior to Visit  Medication Sig Dispense Refill  . phentermine 37.5 MG capsule Take 37.5 mg by mouth every morning.    . cyclobenzaprine (FLEXERIL) 10 MG tablet Take 1 tablet (10 mg total) by mouth 2 (two) times daily as needed for muscle spasms. (Patient not taking: Reported on 02/20/2018) 25 tablet 0   No facility-administered medications prior to visit.     ROS See HPI  Objective:  BP 120/78   Pulse 74   Temp 98.7 F (37.1 C) (Oral)   Ht 5\' 2"  (1.575 m)   Wt 253 lb (114.8 kg)   SpO2 97%   BMI 46.27 kg/m   BP Readings from Last 3 Encounters:  02/20/18 120/78  02/07/17 122/73  11/03/16 124/84    Wt Readings from Last 3 Encounters:  02/20/18 253 lb (114.8 kg)  02/07/17 257 lb 8 oz (116.8 kg)  11/03/16 262 lb 9.6 oz (119.1 kg)    Physical Exam Vitals signs reviewed.  HENT:     Head:     Jaw: No trismus.     Right Ear: Tympanic membrane, ear canal and external ear normal.     Left Ear: Tympanic membrane, ear canal and external ear normal.     Nose: Mucosal edema and rhinorrhea present.     Right Sinus: Maxillary sinus tenderness and frontal sinus tenderness present.     Left Sinus: Maxillary sinus tenderness and frontal sinus  tenderness present.     Mouth/Throat:     Pharynx: Posterior oropharyngeal erythema present. No oropharyngeal exudate.  Cardiovascular:     Rate and Rhythm: Normal rate and regular rhythm.  Pulmonary:     Effort: Pulmonary effort is normal.     Breath sounds: Normal breath sounds.  Neurological:     Mental Status: She is alert and oriented to person, place, and time.     Lab Results  Component Value Date   WBC 4.8 11/03/2016   HGB 13.0 11/03/2016   HCT 40.4 11/03/2016   PLT 198.0 11/03/2016   GLUCOSE 91 11/03/2016   CHOL 187 11/03/2016   TRIG 37.0 11/03/2016   HDL 64.30 11/03/2016   LDLCALC 116 (H) 11/03/2016   ALT 12 11/03/2016   AST 15 11/03/2016   NA 136 11/03/2016   K 4.1 11/03/2016   CL 104 11/03/2016   CREATININE 0.96 11/03/2016   BUN 11 11/03/2016   CO2 28 11/03/2016   TSH 1.86 11/03/2016   HGBA1C 5.1 12/06/2011    Dg Chest 2 View  Result Date: 08/05/2016 CLINICAL DATA:  Palpitations EXAM: CHEST  2 VIEW COMPARISON:  Chest CT and chest radiograph 12/10/2010 FINDINGS: The heart size and mediastinal contours are within normal limits. Both lungs are clear. The visualized skeletal structures are unremarkable. IMPRESSION: No active cardiopulmonary disease. Electronically Signed   By: Caryn Bee  Chase PicketHerman M.D.   On: 08/05/2016 17:45    Assessment & Plan:   Camelia Engerri was seen today for cough.  Diagnoses and all orders for this visit:  Flu-like symptoms -     POC Influenza A&B(BINAX/QUICKVUE) -     oseltamivir (TAMIFLU) 75 MG capsule; Take 1 capsule (75 mg total) by mouth 2 (two) times daily. -     Chlorphen-PE-Acetaminophen 4-10-325 MG TABS; Take 1 tablet by mouth every 12 (twelve) hours for 2 days. -     guaiFENesin-dextromethorphan (ROBITUSSIN DM) 100-10 MG/5ML syrup; Take 5 mLs by mouth every 4 (four) hours as needed for cough. -     HYDROcodone-homatropine (HYCODAN) 5-1.5 MG/5ML syrup; Take 5 mLs by mouth every 12 (twelve) hours as needed for cough.   I am having Marabeth  Dehart-Burris start on oseltamivir, Chlorphen-PE-Acetaminophen, guaiFENesin-dextromethorphan, and HYDROcodone-homatropine. I am also having her maintain her cyclobenzaprine and phentermine.  Meds ordered this encounter  Medications  . oseltamivir (TAMIFLU) 75 MG capsule    Sig: Take 1 capsule (75 mg total) by mouth 2 (two) times daily.    Dispense:  10 capsule    Refill:  0    Order Specific Question:   Supervising Provider    Answer:   Dianne DunARON, TALIA M [[id number]]  . Chlorphen-PE-Acetaminophen 4-10-325 MG TABS    Sig: Take 1 tablet by mouth every 12 (twelve) hours for 2 days.    Dispense:  4 tablet    Refill:  0    Order Specific Question:   Supervising Provider    Answer:   Dianne DunARON, TALIA M [[id number]]  . guaiFENesin-dextromethorphan (ROBITUSSIN DM) 100-10 MG/5ML syrup    Sig: Take 5 mLs by mouth every 4 (four) hours as needed for cough.    Dispense:  118 mL    Refill:  0    Order Specific Question:   Supervising Provider    Answer:   Dianne DunARON, TALIA M [[id number]]  . HYDROcodone-homatropine (HYCODAN) 5-1.5 MG/5ML syrup    Sig: Take 5 mLs by mouth every 12 (twelve) hours as needed for cough.    Dispense:  60 mL    Refill:  0    Order Specific Question:   Supervising Provider    Answer:   Dianne DunARON, TALIA M [[id number]]    Problem List Items Addressed This Visit    None    Visit Diagnoses    Flu-like symptoms    -  Primary   Relevant Medications   oseltamivir (TAMIFLU) 75 MG capsule   Chlorphen-PE-Acetaminophen 4-10-325 MG TABS   guaiFENesin-dextromethorphan (ROBITUSSIN DM) 100-10 MG/5ML syrup   HYDROcodone-homatropine (HYCODAN) 5-1.5 MG/5ML syrup   Other Relevant Orders   POC Influenza A&B(BINAX/QUICKVUE) (Completed)       Follow-up: No follow-ups on file.  Alysia Pennaharlotte Liliani Bobo, NP

## 2018-02-20 NOTE — Telephone Encounter (Signed)
Pt. Reports she started having flu symptoms 2 days ago. 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 Reports she had had fever, headache, body aches, cough. No availability at [facility]. Appointment made at [facility].  Reason for Disposition . Fever present > 3 days (72 hours)  Answer Assessment - Initial Assessment Questions 1. WORST SYMPTOM: "What is your worst symptom?" (e.g., cough, runny nose, muscle aches, headache, sore throat, fever)      Headache, body aches, fever,cough 2. ONSET: "When did your flu symptoms start?"      2 days ago 3. COUGH: "How bad is the cough?"        Moderate 4. RESPIRATORY DISTRESS: "Describe your breathing."      No distress 5. FEVER: "Do you have a fever?" If so, ask: "What is your temperature, how was it measured, and when did it start?"     Last night 101. This morning 100 6. EXPOSURE: "Were you exposed to someone with influenza?"       Yes - at school 7. FLU VACCINE: "Did you get a flu shot this year?"     No 8. HIGH RISK DISEASE: "Do you any chronic medical problems?" (e.g., heart or lung disease, asthma, weak immune system, or other HIGH RISK conditions)     No 9. PREGNANCY: "Is there any chance you are pregnant?" "When was your last menstrual period?"     No 10. OTHER SYMPTOMS: "Do you have any other symptoms?"  (e.g., runny nose, muscle aches, headache, sore throat)       No  Protocols used: INFLUENZA - SEASONAL-A-AH

## 2018-02-22 ENCOUNTER — Ambulatory Visit: Payer: Self-pay

## 2018-02-22 NOTE — Telephone Encounter (Signed)
Incoming call from Patient  Stating she feels fine denies fever.  Denies shortness of breath.  Patient states that she noticed last night.  She due Due for a dose of cough medicine now.  Was just  a little concern of new onset of wheezing.  Rates it mild.  Denies lug Hx. Lung hx.  Patient reports spiting up mucous has  Not determined what color it is.  Recommended that Patient continue with medication as prescribed .  Patient denies being in distress  Wanted to let Dr.  Laury Axon - chase be aware Encouraged to drink 8 to 10 glasses of water or more.   If sx worsen  Call us back if Sx. Worsen.  Patient voices understanding.     Answer Assessment - Initial Assessment Questions 1. RESPIRATORY STATUS: "Describe your breathing?" (e.g., wheezing, shortness of breath, unable to speak, severe coughing)      wheezzing  2. ONSET: "When did this breathing problem begin?"       last 3. PATTERN "Does the difficult breathing come and go, or has it been constant since it started?"      Comes and  4. SEVERITY: "How bad is your breathing?" (e.g., mild, moderate, severe)    - MILD: No SOB at rest, mild SOB with walking, speaks normally in sentences, can lay down, no retractions, pulse < 100.    - MODERATE: SOB at rest, SOB with minimal exertion and prefers to sit, cannot lie down flat, speaks in phrases, mild retractions, audible wheezing, pulse 100-120.    - SEVERE: Very SOB at rest, speaks in single words, struggling to breathe, sitting hunched forward, retractions, pulse > 120      mild 5. RECURRENT SYMPTOM: "Have you had difficulty breathing before?" If so, ask: "When was the last time?" and "What happened that time?"      no 6. CARDIAC HISTORY: "Do you have any history of heart disease?" (e.g., heart attack, angina, bypass surgery, angioplasty)      denies 7. LUNG HISTORY: "Do you have any history of lung disease?"  (e.g., pulmonary embolus, asthma, emphysema)     denies 8. CAUSE: "What do you think is causing  the breathing problem?"      9. OTHER SYMPTOMS: "Do you have any other symptoms? (e.g., dizziness, runny nose, cough, chest pain, fever)     Cough, yellow 10. PREGNANCY: "Is there any chance you are pregnant?" "When was your last menstrual period?"       denies 80. TRAVEL: "Have you traveled out of the country in the last month?" (e.g., travel history, exposures)       denie  Protocols used: BREATHING DIFFICULTY-A-AH

## 2018-12-17 ENCOUNTER — Telehealth: Payer: Self-pay | Admitting: Family Medicine

## 2018-12-17 NOTE — Telephone Encounter (Signed)
Patient would like to TOC from [facility] to [place] with Wilfred Lacy, NP.   Patient lives closer to [employer] back [phone]

## 2018-12-17 NOTE — Telephone Encounter (Signed)
Ok with me 

## 2018-12-17 NOTE — Telephone Encounter (Signed)
ok 

## 2019-02-21 ENCOUNTER — Encounter: Payer: Self-pay | Admitting: Nurse Practitioner

## 2019-02-21 ENCOUNTER — Telehealth: Payer: Managed Care, Other (non HMO) | Admitting: Nurse Practitioner

## 2019-04-18 LAB — HEPATIC FUNCTION PANEL
ALT: 12 (ref 7–35)
AST: 14 (ref 13–35)
Alkaline Phosphatase: 58 (ref 25–125)

## 2019-04-18 LAB — CBC AND DIFFERENTIAL
Hemoglobin: 12.9 (ref 12.0–16.0)
Platelets: 228 (ref 150–399)
WBC: 3.7

## 2019-04-18 LAB — BASIC METABOLIC PANEL
BUN: 14 (ref 4–21)
CO2: 25 — AB (ref 13–22)
Chloride: 106 (ref 99–108)
Creatinine: 1.1 (ref 0.5–1.1)
Glucose: 108
Potassium: 4.2 (ref 3.4–5.3)
Sodium: 138 (ref 137–147)

## 2019-04-18 LAB — COMPREHENSIVE METABOLIC PANEL
Albumin: 3.9 (ref 3.5–5.0)
Calcium: 9.3 (ref 8.7–10.7)
GFR calc Af Amer: 64
GFR calc non Af Amer: 53
Globulin: 2.7

## 2019-04-18 LAB — LIPID PANEL
Cholesterol: 198 (ref 0–200)
HDL: 76 — AB (ref 35–70)
Triglycerides: 43 (ref 40–160)

## 2019-04-18 LAB — HEMOGLOBIN A1C: Hemoglobin A1C: 4.9

## 2019-04-18 LAB — CBC: RBC: 4.41 (ref 3.87–5.11)

## 2019-06-09 ENCOUNTER — Other Ambulatory Visit: Payer: Self-pay

## 2019-06-09 ENCOUNTER — Ambulatory Visit (INDEPENDENT_AMBULATORY_CARE_PROVIDER_SITE_OTHER): Payer: Managed Care, Other (non HMO) | Admitting: Nurse Practitioner

## 2019-06-09 ENCOUNTER — Encounter: Payer: Self-pay | Admitting: Nurse Practitioner

## 2019-06-09 VITALS — BP 128/90 | HR 73 | Temp 98.0°F | Ht 62.0 in | Wt 256.6 lb

## 2019-06-09 DIAGNOSIS — S46812A Strain of other muscles, fascia and tendons at shoulder and upper arm level, left arm, initial encounter: Secondary | ICD-10-CM

## 2019-06-09 MED ORDER — CYCLOBENZAPRINE HCL 10 MG PO TABS: 10.0000 mg | ORAL_TABLET | Freq: Every day | ORAL | 0 refills | Status: DC

## 2019-06-09 MED ORDER — KETOROLAC TROMETHAMINE 30 MG/ML IJ SOLN
30.0000 mg | Freq: Once | INTRAMUSCULAR | Status: AC
  Administered 2019-06-09: 30 mg via INTRAMUSCULAR

## 2019-06-09 MED ORDER — IBUPROFEN 600 MG PO TABS: 600.0000 mg | ORAL_TABLET | Freq: Three times a day (TID) | ORAL | 0 refills | Status: DC | PRN

## 2019-06-09 NOTE — Progress Notes (Signed)
Subjective:  Patient ID: Helen Miles, female    DOB: 04-Oct-1970  Age: 49 y.o. MRN: [id number]  CC: Back Pain (pt c/o left upper back and shoulder painful/going on 2 days/ibuprofen,heat, ice, alave otc/)  Back Pain This is a new problem. The current episode started in the past 7 days. The problem occurs constantly. The problem is unchanged. The pain is present in the thoracic spine. The quality of the pain is described as cramping and aching. The pain does not radiate. The pain is at a severity of 9/10. The symptoms are aggravated by lying down and twisting. Pertinent negatives include no chest pain, headaches, numbness, paresis, paresthesias, tingling or weakness. Risk factors include obesity and poor posture (onset after weight training and chest exercise routine). She has tried heat and NSAIDs for the symptoms. The treatment provided no relief.   Reviewed past Medical, Social and Family history today.  Outpatient Medications Prior to Visit  Medication Sig Dispense Refill  . buPROPion (WELLBUTRIN XL) 150 MG 24 hr tablet Take 150 mg by mouth daily.    . phentermine 37.5 MG capsule Take 37.5 mg by mouth every morning.    . cyclobenzaprine (FLEXERIL) 10 MG tablet Take 1 tablet (10 mg total) by mouth 2 (two) times daily as needed for muscle spasms. (Patient not taking: Reported on 02/20/2018) 25 tablet 0  . guaiFENesin-dextromethorphan (ROBITUSSIN DM) 100-10 MG/5ML syrup Take 5 mLs by mouth every 4 (four) hours as needed for cough. (Patient not taking: Reported on 06/09/2019) 118 mL 0  . HYDROcodone-homatropine (HYCODAN) 5-1.5 MG/5ML syrup Take 5 mLs by mouth every 12 (twelve) hours as needed for cough. (Patient not taking: Reported on 06/09/2019) 60 mL 0  . oseltamivir (TAMIFLU) 75 MG capsule Take 1 capsule (75 mg total) by mouth 2 (two) times daily. (Patient not taking: Reported on 06/09/2019) 10 capsule 0   No facility-administered medications prior to visit.    ROS See HPI  Objective:    BP 128/90   Pulse 73   Temp 98 F (36.7 C) (Tympanic)   Ht 5\' 2"  (1.575 m)   Wt 256 lb 9.6 oz (116.4 kg)   SpO2 99%   BMI 46.93 kg/m   BP Readings from Last 3 Encounters:  06/09/19 128/90  02/20/18 120/78  02/07/17 122/73    Wt Readings from Last 3 Encounters:  06/09/19 256 lb 9.6 oz (116.4 kg)  02/20/18 253 lb (114.8 kg)  02/07/17 257 lb 8 oz (116.8 kg)    Physical Exam Vitals reviewed.  Cardiovascular:     Rate and Rhythm: Normal rate.     Pulses: Normal pulses.  Pulmonary:     Effort: Pulmonary effort is normal.  Chest:     Chest wall: No tenderness.  Musculoskeletal:        General: Tenderness present. No swelling.     Right shoulder: Normal.     Left shoulder: Normal.     Right upper arm: Normal.     Left upper arm: Normal.       Arms:     Cervical back: Normal and normal range of motion.     Thoracic back: Spasms and tenderness present. No bony tenderness.     Lumbar back: Normal.  Lymphadenopathy:     Cervical: No cervical adenopathy.  Neurological:     Mental Status: She is alert and oriented to person, place, and time.     Motor: No weakness.     Lab Results  Component Value Date  WBC 4.8 11/03/2016   HGB 13.0 11/03/2016   HCT 40.4 11/03/2016   PLT 198.0 11/03/2016   GLUCOSE 91 11/03/2016   CHOL 187 11/03/2016   TRIG 37.0 11/03/2016   HDL 64.30 11/03/2016   LDLCALC 116 (H) 11/03/2016   ALT 12 11/03/2016   AST 15 11/03/2016   NA 136 11/03/2016   K 4.1 11/03/2016   CL 104 11/03/2016   CREATININE 0.96 11/03/2016   BUN 11 11/03/2016   CO2 28 11/03/2016   TSH 1.86 11/03/2016   HGBA1C 5.1 12/06/2011   Assessment & Plan:  This visit occurred during the SARS-CoV-2 public health emergency.  Safety protocols were in place, including screening questions prior to the visit, additional usage of staff PPE, and extensive cleaning of exam room while observing appropriate contact time as indicated for disinfecting solutions.   Helen Miles was seen today  for back pain.  Diagnoses and all orders for this visit:  Strain of trapezius muscle, left, initial encounter -     ibuprofen (ADVIL) 600 MG tablet; Take 1 tablet (600 mg total) by mouth every 8 (eight) hours as needed for moderate pain (with food). -     cyclobenzaprine (FLEXERIL) 10 MG tablet; Take 1 tablet (10 mg total) by mouth at bedtime. -     ketorolac (TORADOL) 30 MG/ML injection 30 mg   I have discontinued Lavonna Dehart-Burris's cyclobenzaprine, oseltamivir, guaiFENesin-dextromethorphan, and HYDROcodone-homatropine. I am also having her start on ibuprofen and cyclobenzaprine. Additionally, I am having her maintain her phentermine and buPROPion. We administered ketorolac.  Meds ordered this encounter  Medications  . ibuprofen (ADVIL) 600 MG tablet    Sig: Take 1 tablet (600 mg total) by mouth every 8 (eight) hours as needed for moderate pain (with food).    Dispense:  30 tablet    Refill:  0    Order Specific Question:   Supervising Provider    Answer:   Ronnald Nian [[id number]]  . cyclobenzaprine (FLEXERIL) 10 MG tablet    Sig: Take 1 tablet (10 mg total) by mouth at bedtime.    Dispense:  14 tablet    Refill:  0    Order Specific Question:   Supervising Provider    Answer:   Ronnald Nian [[id number]]  . ketorolac (TORADOL) 30 MG/ML injection 30 mg    Problem List Items Addressed This Visit    None    Visit Diagnoses    Strain of trapezius muscle, left, initial encounter    -  Primary   Relevant Medications   ibuprofen (ADVIL) 600 MG tablet   cyclobenzaprine (FLEXERIL) 10 MG tablet   ketorolac (TORADOL) 30 MG/ML injection 30 mg (Completed)       Follow-up: Return if symptoms worsen or fail to improve.  Wilfred Lacy, NP

## 2019-06-09 NOTE — Patient Instructions (Addendum)
Use cold compress every 2hrs ( ) Call office if no improvement in 1week.  Muscle Strain A muscle strain is an injury that happens when a muscle is stretched longer than normal. This can happen during a fall, sports, or lifting. This can tear some muscle fibers. Usually, recovery from muscle strain takes 1-2 weeks. Complete healing normally takes 5-6 weeks. This condition is first treated with PRICE therapy. This involves:  Protecting your muscle from being injured again.  Resting your injured muscle.  Icing your injured muscle.  Applying pressure (compression) to your injured muscle. This may be done with a splint or elastic bandage.  Raising (elevating) your injured muscle. Your doctor may also recommend medicine for pain. Follow these instructions at home: If you have a splint:  Wear the splint as told by your doctor. Take it off only as told by your doctor.  Loosen the splint if your fingers or toes tingle, get numb, or turn cold and blue.  Keep the splint clean.  If the splint is not waterproof: ? Do not let it get wet. ? Cover it with a watertight covering when you take a bath or a shower. Managing pain, stiffness, and swelling   If directed, put ice on your injured area. ? If you have a removable splint, take it off as told by your doctor. ? Put ice in a plastic bag. ? Place a towel between your skin and the bag. ? Leave the ice on for 20 minutes, 2-3 times a day.  Move your fingers or toes often. This helps to avoid stiffness and lessen swelling.  Raise your injured area above the level of your heart while you are sitting or lying down.  Wear an elastic bandage as told by your doctor. Make sure it is not too tight. General instructions  Take over-the-counter and prescription medicines only as told by your doctor.  Limit your activity. Rest your injured muscle as told by your doctor. Your doctor may say that gentle movements are okay.  If physical therapy  was prescribed, do exercises as told by your doctor.  Do not put pressure on any part of the splint until it is fully hardened. This may take many hours.  Do not use any products that contain nicotine or tobacco, such as cigarettes and e-cigarettes. These can delay bone healing. If you need help quitting, ask your doctor.  Warm up before you exercise. This helps to prevent more muscle strains.  Ask your doctor when it is safe to drive if you have a splint.  Keep all follow-up visits as told by your doctor. This is important. Contact a doctor if:  You have more pain or swelling in your injured area. Get help right away if:  You have any of these problems in your injured area: ? You have numbness. ? You have tingling. ? You lose a lot of strength. Summary  A muscle strain is an injury that happens when a muscle is stretched longer than normal.  This condition is first treated with PRICE therapy. This includes protecting, resting, icing, adding pressure, and raising your injury.  Limit your activity. Rest your injured muscle as told by your doctor. Your doctor may say that gentle movements are okay.  Warm up before you exercise. This helps to prevent more muscle strains. This information is not intended to replace advice given to you by your health care provider. Make sure you discuss any questions you have with your health care provider. Document Revised:  03/28/2018 Document Reviewed: 03/08/2016 Elsevier Patient Education  2020 [employer].

## 2019-06-27 ENCOUNTER — Encounter: Payer: Self-pay | Admitting: Nurse Practitioner

## 2019-06-27 ENCOUNTER — Other Ambulatory Visit: Payer: Self-pay

## 2019-06-27 ENCOUNTER — Telehealth (INDEPENDENT_AMBULATORY_CARE_PROVIDER_SITE_OTHER): Payer: Managed Care, Other (non HMO) | Admitting: Nurse Practitioner

## 2019-06-27 VITALS — Temp 99.5°F | Ht 62.0 in | Wt 260.0 lb

## 2019-06-27 DIAGNOSIS — J069 Acute upper respiratory infection, unspecified: Secondary | ICD-10-CM | POA: Diagnosis not present

## 2019-06-27 MED ORDER — OSELTAMIVIR PHOSPHATE 75 MG PO CAPS: 75.0000 mg | ORAL_CAPSULE | Freq: Two times a day (BID) | ORAL | 0 refills | Status: DC

## 2019-06-27 MED ORDER — PROMETHAZINE HCL 25 MG PO TABS: 25.0000 mg | ORAL_TABLET | Freq: Three times a day (TID) | ORAL | 0 refills | Status: DC | PRN

## 2019-06-27 MED ORDER — BISMUTH SUBSALICYLATE 262 MG/15ML PO SUSP: 30.0000 mL | Freq: Three times a day (TID) | ORAL | 0 refills | Status: DC

## 2019-06-27 NOTE — Patient Instructions (Signed)
Call [phone] for COVID test appt Maintain clear liquid diet. Go to ED if symptoms do not improve in 48hrs.  Viral Gastroenteritis, Adult  Viral gastroenteritis is also known as the stomach flu. This condition may affect your stomach, your small intestine, and your large intestine. It can cause sudden watery poop (diarrhea), fever, and throwing up (vomiting). This condition is caused by certain germs (viruses). These germs can be passed from person to person very easily (are contagious). Having watery poop and throwing up can make you feel weak and cause you to not have enough water in your body (get dehydrated). This can make you tired and thirsty, make you have a dry mouth, and make it so you pee (urinate) less often. It is important to replace the fluids that you lose from having watery poop and throwing up. What are the causes?  You can get sick by catching viruses from other people.  You can also get sick by: ? Eating food, drinking water, or touching a surface that has the viruses on it (is contaminated). ? Sharing utensils or other personal items with a person who is sick. What increases the risk?  Having a weak body defense system (immune system).  Living with one or more children who are younger than 70 years old.  Living in a nursing home.  Going on cruise ships. What are the signs or symptoms? Symptoms of this condition start suddenly. Symptoms may last for a few days or for as long as a week.  Common symptoms include: ? Watery poop. ? Throwing up.  Other symptoms include: ? Fever. ? Headache. ? Feeling tired (fatigue). ? Pain in the belly (abdomen). ? Chills. ? Feeling weak. ? Feeling sick to your stomach (nauseous). ? Muscle aches. ? Not feeling hungry. How is this treated?  This condition typically goes away on its own.  The focus of treatment is to replace the fluids that you lose. This condition may be treated with: ? An ORS (oral rehydration solution).  This is a drink that is sold at pharmacies and stores. ? Medicines to help with your symptoms. ? Probiotic supplements to reduce symptoms of diarrhea. ? Fluids given through an IV tube, if needed.  Older adults and people with other diseases or a weak body defense system are at higher risk for not having enough water in the body. Follow these instructions at home: Eating and drinking   Take an ORS as told by your doctor.  Drink clear fluids in small amounts as you are able. Clear fluids include: ? Water. ? Ice chips. ? Fruit juice with water added to it (diluted). ? Low-calorie sports drinks.  Drink enough fluid to keep your pee (urine) pale yellow.  Eat small amounts of healthy foods every 3-4 hours as you are able. This may include whole grains, fruits, vegetables, lean meats, and yogurt.  Avoid fluids that have a lot of sugar or caffeine in them, such as energy drinks, sports drinks, and soda.  Avoid spicy or fatty foods.  Avoid alcohol. General instructions   Wash your hands often. This is very important after you have watery poop or you throw up. If you cannot use soap and water, use hand sanitizer.  Make sure that all people in your home wash their hands well and often.  Take over-the-counter and prescription medicines only as told by your doctor.  Rest at home while you get better.  Watch your condition for any changes.  Take a warm bath  to help with any burning or pain from having watery poop.  Keep all follow-up visits as told by your doctor. This is important. Contact a doctor if:  You cannot keep fluids down.  Your symptoms get worse.  You have new symptoms.  You feel light-headed.  You feel dizzy.  You have muscle cramps. Get help right away if:  You have chest pain.  You feel very weak.  You pass out (faint).  You see blood in your throw-up.  Your throw-up looks like coffee grounds.  You have bloody or black poop (stools) or poop that  looks like tar.  You have a very bad headache, or a stiff neck, or both.  You have a rash.  You have very bad pain, cramping, or bloating in your belly.  You have trouble breathing.  You are breathing very quickly.  You have a fast heartbeat.  Your skin feels cold and clammy.  You feel mixed up (confused).  You have pain when you pee.  You have signs of not having enough water in the body, such as: ? Dark pee, hardly any pee, or no pee. ? Cracked lips. ? Dry mouth. ? Sunken eyes. ? Feeling very sleepy. ? Feeling weak. Summary  Viral gastroenteritis is also known as the stomach flu.  This condition can cause sudden watery poop (diarrhea), fever, and throwing up (vomiting).  These germs can be passed from person to person very easily.  Take an ORS as told by your doctor. This is a drink that is sold at pharmacies and stores.  Drink fluids in small amounts many times each day as you are able. This information is not intended to replace advice given to you by your health care provider. Make sure you discuss any questions you have with your health care provider. Document Revised: 12/05/2017 Document Reviewed: 12/05/2017 Elsevier Patient Education  2020 [employer].

## 2019-06-27 NOTE — Progress Notes (Signed)
Virtual Visit via Video Note  I connected with@ on 06/27/19 at  1:00 PM EDT by a video enabled telemedicine application and verified that I am speaking with the correct person using two identifiers.  Location: Patient:Home Provider: Office Participants: patient and provider  I discussed the limitations of evaluation and management by telemedicine and the availability of in person appointments. I also discussed with the patient that there may be a patient responsible charge related to this service. The patient expressed understanding and agreed to proceed.  CC:pt states flu like symptoms//thowring up-can't keep nothing down not even water//diarrhea, body chills and aches all started this morning around 5am//pt did say she was in contact with 1 coworker that had flu  History of Present Illness: Influenza This is a new problem. The current episode started today. The problem occurs constantly. The problem has been unchanged. Associated symptoms include abdominal pain, anorexia, a change in bowel habit, chills, fatigue, a fever, headaches, myalgias, nausea and vomiting. Pertinent negatives include no arthralgias, chest pain, congestion, coughing, diaphoresis, joint swelling, neck pain, numbness, rash, sore throat, swollen glands, urinary symptoms, vertigo, visual change or weakness. Exacerbated by: eating/drinking. She has tried nothing for the symptoms.  she had 3episodes of diarrhea and 4episodes of emesis, no hematochezia or melena. contact with coworker on 06/20/2019, whom had positive flu test and negative COVID test on 06/24/2019.  Observations/Objective: Physical Exam  Constitutional: She is oriented to person, place, and time. No distress.  Pulmonary/Chest: Effort normal.  Neurological: She is alert and oriented to person, place, and time.    Assessment and Plan: Siria was seen today for influenza.  Diagnoses and all orders for this visit:  Viral upper respiratory tract infection -      promethazine (PHENERGAN) 25 MG tablet; Take 1 tablet (25 mg total) by mouth every 8 (eight) hours as needed for nausea or vomiting. -     oseltamivir (TAMIFLU) 75 MG capsule; Take 1 capsule (75 mg total) by mouth 2 (two) times daily. -     bismuth subsalicylate (PEPTO BISMOL) 262 MG/15ML suspension; Take 30 mLs by mouth 4 (four) times daily -  before meals and at bedtime.   Follow Up Instructions: Call [phone] for COVID test appt Maintain clear liquid diet. Go to ED if symptoms do not improve in 48hrs.  I discussed the assessment and treatment plan with the patient. The patient was provided an opportunity to ask questions and all were answered. The patient agreed with the plan and demonstrated an understanding of the instructions.   The patient was advised to call back or seek an in-person evaluation if the symptoms worsen or if the condition fails to improve as anticipated.   Alysia Penna, NP

## 2019-07-01 ENCOUNTER — Encounter: Payer: Managed Care, Other (non HMO) | Admitting: Nurse Practitioner

## 2019-07-28 LAB — HM PAP SMEAR: HM Pap smear: NORMAL

## 2019-08-04 ENCOUNTER — Encounter: Payer: Self-pay | Admitting: Nurse Practitioner

## 2019-08-04 ENCOUNTER — Ambulatory Visit (INDEPENDENT_AMBULATORY_CARE_PROVIDER_SITE_OTHER): Payer: Managed Care, Other (non HMO) | Admitting: Nurse Practitioner

## 2019-08-04 ENCOUNTER — Other Ambulatory Visit: Payer: Self-pay

## 2019-08-04 VITALS — BP 122/82 | HR 80 | Ht 62.0 in | Wt 245.4 lb

## 2019-08-04 DIAGNOSIS — E782 Mixed hyperlipidemia: Secondary | ICD-10-CM

## 2019-08-04 DIAGNOSIS — Z23 Encounter for immunization: Secondary | ICD-10-CM | POA: Diagnosis not present

## 2019-08-04 DIAGNOSIS — Z0000 Encounter for general adult medical examination without abnormal findings: Secondary | ICD-10-CM | POA: Diagnosis not present

## 2019-08-04 DIAGNOSIS — Z8371 Family history of colonic polyps: Secondary | ICD-10-CM | POA: Insufficient documentation

## 2019-08-04 NOTE — Assessment & Plan Note (Addendum)
Under the [facility] of [facility] current use of  ozempic and wellbutrin, phentermine She has labs completed 04/2019: CBC, CMP, lipid panel, TSH, vitamin D, and HgbA1c (reviewed results): normal HgbA1c, mild elevation in LDL. Diet:low carb and low fat Exercise:daily, cardio and strengthening 5x/week Wt Readings from Last 3 Encounters:  08/04/19 245 lb 6.4 oz (111.3 kg)  06/27/19 260 lb (117.9 kg)  06/09/19 256 lb 9.6 oz (116.4 kg)

## 2019-08-04 NOTE — Progress Notes (Signed)
Subjective:    Patient ID: Helen Miles, female    DOB: 08-01-1970, 49 y.o.   MRN: [id number]  Patient presents today for complete physical    HPI Obesity: Under the care of [facility] current use of  ozempic and wellbutrin, phentermine She has labs completed 04/2019: CBC, CMP, lipid panel, TSH, vitamin D, and HgbA1c (reviewed results): normal HgbA1c, mild elevation in LDL. Diet:low carb and low fat Exercise:daily, cardio and strengthening 5x/week Wt Readings from Last 3 Encounters:  08/04/19 245 lb 6.4 oz (111.3 kg)  06/27/19 260 lb (117.9 kg)  06/09/19 256 lb 9.6 oz (116.4 kg)   Sexual History (orientation,birth control, marital status, STD):married, sexually active, up to date with breast and pelvic exam per patient, done by GYN: Dr. Dareen Piano.  Depression/Suicide: Depression screen [facility] 2/9 08/04/2019 11/03/2016  Decreased Interest 0 0  Down, Depressed, Hopeless 0 0  PHQ - 2 Score 0 0   Vision:up to date  Dental:up to date  Immunizations: (TDAP, Hep C screen, Pneumovax, Influenza, zoster)  Health Maintenance  Topic Date Due  . COVID-19 Vaccine (1) Never done  . Tetanus Vaccine  12/05/2016  . Pap Smear  12/20/2018  .  Hepatitis C: One time screening is recommended by Center for Disease Control  (CDC) for  adults born from [birth year] through 1964.   08/03/2020*  . HIV Screening  08/03/2020*  . Flu Shot  09/14/2019  *Topic was postponed. The date shown is not the original due date.   Medications and allergies reviewed with patient and updated if appropriate.  Patient Active Problem List   Diagnosis Date Noted  . Family history of colonic polyps 08/04/2019  . Headache(784.0) 06/03/2009  . TACHYCARDIA, HX OF 06/03/2009  . Obesity, morbid ([facility]) 11/12/2008  . HIATAL HERNIA 10/23/2007  . HYPERLIPIDEMIA 12/06/2006    Current Outpatient Medications on File Prior to Visit  Medication Sig Dispense Refill  . buPROPion (WELLBUTRIN XL) 150 MG 24 hr tablet  Take 150 mg by mouth daily.    . cyclobenzaprine (FLEXERIL) 10 MG tablet Take 1 tablet (10 mg total) by mouth at bedtime. 14 tablet 0  . ibuprofen (ADVIL) 600 MG tablet Take 1 tablet (600 mg total) by mouth every 8 (eight) hours as needed for moderate pain (with food). 30 tablet 0  . phentermine 37.5 MG capsule Take 37.5 mg by mouth every morning.    . Semaglutide,0.25 or 0.5MG /DOS, (OZEMPIC, 0.25 OR 0.5 MG/DOSE,) 2 MG/1.5ML SOPN Ozempic 0.25 mg or 0.5 mg (2 mg/1.5 mL) subcutaneous pen injector    . ergocalciferol (VITAMIN D2) 1.25 MG (50000 UT) capsule ergocalciferol (vitamin D2) 1,250 mcg (50,000 unit) capsule  TAKE 1 CAPSULE BY MOUTH ONCE A WEEK     No current facility-administered medications on file prior to visit.    Past Medical History:  Diagnosis Date  . Generalized headaches     Past Surgical History:  Procedure Laterality Date  . CESAREAN SECTION     G 3 P 2 ( C section X2)  . WISDOM TOOTH EXTRACTION      Social History   Socioeconomic History  . Marital status: Married    Spouse name: Not on file  . Number of children: Not on file  . Years of education: Not on file  . Highest education level: Not on file  Occupational History  . Not on file  Tobacco Use  . Smoking status: Never Smoker  . Smokeless tobacco: Never Used  Substance and Sexual Activity  .  Alcohol use: Yes    Comment: RARE, 1 glass wine/ month  . Drug use: No  . Sexual activity: Yes    Birth control/protection: Surgical  Other Topics Concern  . Not on file  Social History Narrative  . Not on file   Social Determinants of Health   Financial Resource Strain:   . Difficulty of Paying Living Expenses:   Food Insecurity:   . Worried About Programme researcher, broadcasting/film/video in the Last Year:   . Barista in the Last Year:   Transportation Needs:   . Freight forwarder (Medical):   Marland Kitchen Lack of Transportation (Non-Medical):   Physical Activity:   . Days of Exercise per Week:   . Minutes of Exercise  per Session:   Stress:   . Feeling of Stress :   Social Connections:   . Frequency of Communication with Friends and Family:   . Frequency of Social Gatherings with Friends and Family:   . Attends Religious Services:   . Active Member of Clubs or Organizations:   . Attends Banker Meetings:   Marland Kitchen Marital Status:     Family History  Problem Relation Age of Onset  . Heart attack Father 61  . Lupus Mother        dermatologic  . Lupus Brother        dermatologic  . Hypertension Maternal Grandfather   . Lung cancer Maternal Grandfather        smoker  . Deep vein thrombosis Maternal Grandfather   . Pulmonary embolism Other        5 maternal great uncles  . Hypothyroidism Other        PGGM  . Diabetes Neg Hx   . Stroke Neg Hx         Review of Systems  Constitutional: Negative for fever, malaise/fatigue and weight loss.  HENT: Negative for congestion and sore throat.   Eyes:       Negative for visual changes  Respiratory: Negative for cough and shortness of breath.   Cardiovascular: Negative for chest pain, palpitations and leg swelling.  Gastrointestinal: Negative for blood in stool, constipation, diarrhea and heartburn.  Genitourinary: Negative for dysuria, frequency and urgency.  Musculoskeletal: Negative for falls, joint pain and myalgias.  Skin: Negative for rash.  Neurological: Negative for dizziness, sensory change and headaches.  Endo/Heme/Allergies: Does not bruise/bleed easily.  Psychiatric/Behavioral: Negative for depression, substance abuse and suicidal ideas. The patient is not nervous/anxious.     Objective:   Vitals:   08/04/19 0817  BP: 122/82  Pulse: 80  SpO2: 98%    Body mass index is 44.88 kg/m.   Physical Examination:  Physical Exam Vitals reviewed.  Constitutional:      Appearance: She is obese.  HENT:     Right Ear: Tympanic membrane and ear canal normal.     Left Ear: Tympanic membrane, ear canal and external ear normal.   Eyes:     Extraocular Movements: Extraocular movements intact.     Conjunctiva/sclera: Conjunctivae normal.  Neck:     Thyroid: No thyroid mass, thyromegaly or thyroid tenderness.  Cardiovascular:     Rate and Rhythm: Normal rate and regular rhythm.     Pulses: Normal pulses.     Heart sounds: Normal heart sounds.  Pulmonary:     Effort: Pulmonary effort is normal.     Breath sounds: Normal breath sounds.  Abdominal:     General: Bowel sounds are normal.  Palpations: Abdomen is soft.  Genitourinary:    Comments: Pelvic and breast exam deferred to GYN per patient Musculoskeletal:     Cervical back: Normal range of motion and neck supple.     Right lower leg: No edema.     Left lower leg: No edema.  Lymphadenopathy:     Cervical: No cervical adenopathy.  Skin:    General: Skin is warm and dry.  Neurological:     General: No focal deficit present.     Mental Status: She is alert and oriented to person, place, and time.  Psychiatric:        Mood and Affect: Mood normal.        Behavior: Behavior normal.        Thought Content: Thought content normal.    ASSESSMENT and PLAN: This visit occurred during the SARS-CoV-2 public health emergency.  Safety protocols were in place, including screening questions prior to the visit, additional usage of staff PPE, and extensive cleaning of exam room while observing appropriate contact time as indicated for disinfecting solutions.   Lesbia was seen today for annual exam.  Diagnoses and all orders for this visit:  Preventative health care  HYPERLIPIDEMIA  Need for tetanus booster -     Tdap vaccine greater than or equal to 7yo IM  Obesity, morbid ([facility])  Family history of colonic polyps    Obesity, morbid Under the care of [facility] current use of  ozempic and wellbutrin, phentermine She has labs completed 04/2019: CBC, CMP, lipid panel, TSH, vitamin D, and HgbA1c (reviewed results): normal HgbA1c, mild  elevation in LDL. Diet:low carb and low fat Exercise:daily, cardio and strengthening 5x/week Wt Readings from Last 3 Encounters:  08/04/19 245 lb 6.4 oz (111.3 kg)  06/27/19 260 lb (117.9 kg)  06/09/19 256 lb 9.6 oz (116.4 kg)       Problem List Items Addressed This Visit      Other   Family history of colonic polyps   HYPERLIPIDEMIA   Obesity, morbid ([place])    Under the [facility] of [facility] current use of  ozempic and wellbutrin, phentermine She has labs completed 04/2019: CBC, CMP, lipid panel, TSH, vitamin D, and HgbA1c (reviewed results): normal HgbA1c, mild elevation in LDL. Diet:low carb and low fat Exercise:daily, cardio and strengthening 5x/week Wt Readings from Last 3 Encounters:  08/04/19 245 lb 6.4 oz (111.3 kg)  06/27/19 260 lb (117.9 kg)  06/09/19 256 lb 9.6 oz (116.4 kg)        Relevant Medications   Semaglutide,0.25 or 0.5MG /DOS, (OZEMPIC, 0.25 OR 0.5 MG/DOSE,) 2 MG/1.5ML SOPN    Other Visit Diagnoses    Preventative health care    -  Primary   Need for tetanus booster       Relevant Orders   Tdap vaccine greater than or equal to 7yo IM      Follow up: Return in about 6 months (around 02/03/2020) for hyperlipidemia (fasting).  Wilfred Lacy, NP

## 2019-08-04 NOTE — Patient Instructions (Signed)
We will obtain recent PAP and mammogram from GYN: Dr. Dareen Piano.   Preventive Care 11-48 Years Old, Female Preventive care refers to visits with your health care provider and lifestyle choices that can promote health and wellness. This includes:  A yearly physical exam. This may also be called an annual well check.  Regular dental visits and eye exams.  Immunizations.  Screening for certain conditions.  Healthy lifestyle choices, such as eating a healthy diet, getting regular exercise, not using drugs or products that contain nicotine and tobacco, and limiting alcohol use. What can I expect for my preventive care visit? Physical exam Your health care provider will check your:  Height and weight. This may be used to calculate body mass index (BMI), which tells if you are at a healthy weight.  Heart rate and blood pressure.  Skin for abnormal spots. Counseling Your health care provider may ask you questions about your:  Alcohol, tobacco, and drug use.  Emotional well-being.  Home and relationship well-being.  Sexual activity.  Eating habits.  Work and work Astronomer.  Method of birth control.  Menstrual cycle.  Pregnancy history. What immunizations do I need?  Influenza (flu) vaccine  This is recommended every year. Tetanus, diphtheria, and pertussis (Tdap) vaccine  You may need a Td booster every 10 years. Varicella (chickenpox) vaccine  You may need this if you have not been vaccinated. Zoster (shingles) vaccine  You may need this after age 72. Measles, mumps, and rubella (MMR) vaccine  You may need at least one dose of MMR if you were born in 1957 or later. You may also need a second dose. Pneumococcal conjugate (PCV13) vaccine  You may need this if you have certain conditions and were not previously vaccinated. Pneumococcal polysaccharide (PPSV23) vaccine  You may need one or two doses if you smoke cigarettes or if you have certain  conditions. Meningococcal conjugate (MenACWY) vaccine  You may need this if you have certain conditions. Hepatitis A vaccine  You may need this if you have certain conditions or if you travel or work in places where you may be exposed to hepatitis A. Hepatitis B vaccine  You may need this if you have certain conditions or if you travel or work in places where you may be exposed to hepatitis B. Haemophilus influenzae type b (Hib) vaccine  You may need this if you have certain conditions. Human papillomavirus (HPV) vaccine  If recommended by your health care provider, you may need three doses over 6 months. You may receive vaccines as individual doses or as more than one vaccine together in one shot (combination vaccines). Talk with your health care provider about the risks and benefits of combination vaccines. What tests do I need? Blood tests  Lipid and cholesterol levels. These may be checked every 5 years, or more frequently if you are over 67 years old.  Hepatitis C test.  Hepatitis B test. Screening  Lung cancer screening. You may have this screening every year starting at age 58 if you have a 30-pack-year history of smoking and currently smoke or have quit within the past 15 years.  Colorectal cancer screening. All adults should have this screening starting at age 62 and continuing until age 70. Your health care provider may recommend screening at age 96 if you are at increased risk. You will have tests every 1-10 years, depending on your results and the type of screening test.  Diabetes screening. This is done by checking your blood sugar (glucose)  after you have not eaten for a while (fasting). You may have this done every 1-3 years.  Mammogram. This may be done every 1-2 years. Talk with your health care provider about when you should start having regular mammograms. This may depend on whether you have a family history of breast cancer.  BRCA-related cancer screening. This  may be done if you have a family history of breast, ovarian, tubal, or peritoneal cancers.  Pelvic exam and Pap test. This may be done every 3 years starting at age 26. Starting at age 30, this may be done every 5 years if you have a Pap test in combination with an HPV test. Other tests  Sexually transmitted disease (STD) testing.  Bone density scan. This is done to screen for osteoporosis. You may have this scan if you are at high risk for osteoporosis. Follow these instructions at home: Eating and drinking  Eat a diet that includes fresh fruits and vegetables, whole grains, lean protein, and low-fat dairy.  Take vitamin and mineral supplements as recommended by your health care provider.  Do not drink alcohol if: ? Your health care provider tells you not to drink. ? You are pregnant, may be pregnant, or are planning to become pregnant.  If you drink alcohol: ? Limit how much you have to 0-1 drink a day. ? Be aware of how much alcohol is in your drink. In the [place] one drink equals one 12 oz bottle of beer (355 mL), one 5 oz glass of wine (148 mL), or one 1 oz glass of hard liquor (44 mL). Lifestyle  Take daily care of your teeth and gums.  Stay active. Exercise for at least 30 minutes on 5 or more days each week.  Do not use any products that contain nicotine or tobacco, such as cigarettes, e-cigarettes, and chewing tobacco. If you need help quitting, ask your health care provider.  If you are sexually active, practice safe sex. Use a condom or other form of birth control (contraception) in order to prevent pregnancy and STIs (sexually transmitted infections).  If told by your health care provider, take low-dose aspirin daily starting at age 35. What's next?  Visit your health care provider once a year for a well check visit.  Ask your health care provider how often you should have your eyes and teeth checked.  Stay up to date on all vaccines. This information is not  intended to replace advice given to you by your health care provider. Make sure you discuss any questions you have with your health care provider. Document Revised: 10/11/2017 Document Reviewed: 10/11/2017 Elsevier Patient Education  2020 [employer].

## 2019-08-11 ENCOUNTER — Telehealth: Payer: Managed Care, Other (non HMO) | Admitting: Nurse Practitioner

## 2020-02-16 ENCOUNTER — Ambulatory Visit: Payer: Managed Care, Other (non HMO) | Admitting: Nurse Practitioner

## 2020-03-24 ENCOUNTER — Encounter: Payer: Self-pay | Admitting: Gastroenterology

## 2020-04-08 ENCOUNTER — Ambulatory Visit (INDEPENDENT_AMBULATORY_CARE_PROVIDER_SITE_OTHER): Payer: Managed Care, Other (non HMO) | Admitting: Gastroenterology

## 2020-04-08 ENCOUNTER — Encounter: Payer: Self-pay | Admitting: Gastroenterology

## 2020-04-08 VITALS — BP 120/84 | HR 86 | Ht 61.0 in | Wt 253.0 lb

## 2020-04-08 DIAGNOSIS — Z1211 Encounter for screening for malignant neoplasm of colon: Secondary | ICD-10-CM | POA: Diagnosis not present

## 2020-04-08 MED ORDER — PLENVU 140 G PO SOLR
1.0000 | Freq: Once | ORAL | 0 refills | Status: AC
Start: 2020-04-08 — End: 2020-04-08

## 2020-04-08 NOTE — Patient Instructions (Signed)
If you are age 49 or older, your body mass index should be between 23-30. Your Body mass index is 47.8 kg/m. If this is out of the aforementioned range listed, please consider follow up with your Primary Care Provider.  If you are age 68 or younger, your body mass index should be between 19-25. Your Body mass index is 47.8 kg/m. If this is out of the aformentioned range listed, please consider follow up with your Primary Care Provider.   You have been scheduled for a colonoscopy. Please follow written instructions given to you at your visit today.  Please pick up your prep supplies at the pharmacy within the next 1-3 days. If you use inhalers (even only as needed), please bring them with you on the day of your procedure.  Due to recent changes in healthcare laws, you may see the results of your imaging and laboratory studies on MyChart before your provider has had a chance to review them.  We understand that in some cases there may be results that are confusing or concerning to you. Not all laboratory results come back in the same time frame and the provider may be waiting for multiple results in order to interpret others.  Please give [place] 48 hours in order for your provider to thoroughly review all the results before contacting the office for clarification of your results.

## 2020-04-08 NOTE — Progress Notes (Signed)
04/08/2020 Helen Miles [id number] March 16, 1970   HISTORY OF PRESENT ILLNESS:  This is a pleasant 50 year old female who is new to our office.  She is here today to discuss colonoscopy.  She's never had one in the past.  She says that she's had constipation all of her adult life.  Is currently taking Metamucil every day and Linzess (unknown dose) about every other day with good results.  She denies seeing blood in her stools.  No other complaints.   Past Medical History:  Diagnosis Date  . Generalized headaches   . Obesity    Past Surgical History:  Procedure Laterality Date  . CESAREAN SECTION     G 3 P 2 ( C section X2)  . WISDOM TOOTH EXTRACTION      reports that she has never smoked. She has never used smokeless tobacco. She reports current alcohol use. She reports that she does not use drugs. family history includes Deep vein thrombosis in her maternal grandfather; Heart attack (age of onset: 62) in her father; Hypertension in her maternal grandfather; Hypothyroidism in an other family member; Lung cancer in her maternal grandfather; Lupus in her brother and mother; Pulmonary embolism in an other family member. Allergies  Allergen Reactions  . Penicillins Swelling    Angioedema of lip & tongue  . Contrave [Naltrexone-Bupropion Hcl Er] Itching      Outpatient Encounter Medications as of 04/08/2020  Medication Sig  . buPROPion (WELLBUTRIN XL) 150 MG 24 hr tablet Take 150 mg by mouth daily.  . cyclobenzaprine (FLEXERIL) 10 MG tablet Take 1 tablet (10 mg total) by mouth at bedtime. (Patient taking differently: Take 10 mg by mouth daily as needed.)  . ergocalciferol (VITAMIN D2) 1.25 MG (50000 UT) capsule ergocalciferol (vitamin D2) 1,250 mcg (50,000 unit) capsule  TAKE 1 CAPSULE BY MOUTH ONCE A WEEK  . ibuprofen (ADVIL) 600 MG tablet Take 1 tablet (600 mg total) by mouth every 8 (eight) hours as needed for moderate pain (with food).  . phentermine 37.5 MG capsule Take  37.5 mg by mouth every morning.  . Semaglutide,0.25 or 0.5MG /DOS, (OZEMPIC, 0.25 OR 0.5 MG/DOSE,) 2 MG/1.5ML SOPN Ozempic 0.25 mg or 0.5 mg (2 mg/1.5 mL) subcutaneous pen injector   No facility-administered encounter medications on file as of 04/08/2020.     REVIEW OF SYSTEMS  : All other systems reviewed and negative except where noted in the History of Present Illness.   PHYSICAL EXAM: BP 120/84   Pulse 86   Ht 5\' 1"  (1.549 m)   Wt 253 lb (114.8 kg)   BMI 47.80 kg/m  General: Well developed AA female in no acute distress Head: Normocephalic and atraumatic Eyes:  Sclerae anicteric, conjunctiva pink. Ears: Normal auditory acuity Lungs: Clear throughout to auscultation; no W/R/R. Heart: Regular rate and rhythm; no M/R/G. Abdomen: Soft, non-distended.  BS present.  Non-tender. Rectal:  Will be done at the time of colonoscopy. Musculoskeletal: Symmetrical with no gross deformities  Skin: No lesions on visible extremities Extremities: No edema  Neurological: Alert oriented x 4, grossly non-focal Psychological:  Alert and cooperative. Normal mood and affect  ASSESSMENT AND PLAN: *Screening colonoscopy:  Never had one in the past.  Will schedule with Dr. .  The risks, benefits, and alternatives to colonoscopy were discussed with the patient and she consents to proceed.  *Chronic constipation:  Well controlled on Metamucil every day and Linzess about every other day (not sure of dose).  Will continue for  now.   CC:  Nche, Bonna Gains, NP

## 2020-04-13 LAB — HM COLONOSCOPY

## 2020-04-13 NOTE — Progress Notes (Signed)
____________________________________________________________  Attending physician addendum:  Thank you for sending this case to me. I have reviewed the entire note and agree with the plan.   Henry Danis, MD  ____________________________________________________________  

## 2020-05-10 ENCOUNTER — Encounter: Payer: Self-pay | Admitting: Gastroenterology

## 2020-05-12 ENCOUNTER — Encounter: Payer: Self-pay | Admitting: Certified Registered Nurse Anesthetist

## 2020-05-13 ENCOUNTER — Ambulatory Visit (AMBULATORY_SURGERY_CENTER): Payer: Managed Care, Other (non HMO) | Admitting: Gastroenterology

## 2020-05-13 ENCOUNTER — Other Ambulatory Visit: Payer: Self-pay

## 2020-05-13 ENCOUNTER — Encounter: Payer: Self-pay | Admitting: Gastroenterology

## 2020-05-13 VITALS — BP 145/90 | HR 65 | Temp 98.4°F | Resp 13 | Ht 61.0 in | Wt 253.0 lb

## 2020-05-13 DIAGNOSIS — Z1211 Encounter for screening for malignant neoplasm of colon: Secondary | ICD-10-CM | POA: Diagnosis not present

## 2020-05-13 MED ORDER — SODIUM CHLORIDE 0.9 % IV SOLN: 500.0000 mL | Freq: Once | INTRAVENOUS | Status: DC

## 2020-05-13 NOTE — Patient Instructions (Signed)
Discharge instructions given. Normal exam. Resume previous medications. YOU HAD AN ENDOSCOPIC PROCEDURE TODAY AT THE [facility]:   Refer to the procedure report that was given to you for any specific questions about what was found during the examination.  If the procedure report does not answer your questions, please call your gastroenterologist to clarify.  If you requested that your care partner not be given the details of your procedure findings, then the procedure report has been included in a sealed envelope for you to review at your convenience later.  YOU SHOULD EXPECT: Some feelings of bloating in the abdomen. Passage of more gas than usual.  Walking can help get rid of the air that was put into your GI tract during the procedure and reduce the bloating. If you had a lower endoscopy (such as a colonoscopy or flexible sigmoidoscopy) you may notice spotting of blood in your stool or on the toilet paper. If you underwent a bowel prep for your procedure, you may not have a normal bowel movement for a few days.  Please Note:  You might notice some irritation and congestion in your nose or some drainage.  This is from the oxygen used during your procedure.  There is no need for concern and it should clear up in a day or so.  SYMPTOMS TO REPORT IMMEDIATELY:  Following lower endoscopy (colonoscopy or flexible sigmoidoscopy):  Excessive amounts of blood in the stool  Significant tenderness or worsening of abdominal pains  Swelling of the abdomen that is new, acute  Fever of 100F or higher   For urgent or emergent issues, a gastroenterologist can be reached at any hour by calling [phone]. Do not use MyChart messaging for urgent concerns.    DIET:  We do recommend a small meal at first, but then you may proceed to your regular diet.  Drink plenty of fluids but you should avoid alcoholic beverages for 24 hours.  ACTIVITY:  You should plan to take it easy for the rest of  today and you should NOT DRIVE or use heavy machinery until tomorrow (because of the sedation medicines used during the test).    FOLLOW UP: Our staff will call the number listed on your records 48-72 hours following your procedure to check on you and address any questions or concerns that you may have regarding the information given to you following your procedure. If we do not reach you, we will leave a message.  We will attempt to reach you two times.  During this call, we will ask if you have developed any symptoms of COVID 19. If you develop any symptoms (ie: fever, flu-like symptoms, shortness of breath, cough etc.) before then, please call [phone].  If you test positive for Covid 19 in the 2 weeks post procedure, please call and report this information to us.    If any biopsies were taken you will be contacted by phone or by letter within the next 1-3 weeks.  Please call us at [phone] if you have not heard about the biopsies in 3 weeks.    SIGNATURES/CONFIDENTIALITY: You and/or your care partner have signed paperwork which will be entered into your electronic medical record.  These signatures attest to the fact that that the information above on your After Visit Summary has been reviewed and is understood.  Full responsibility of the confidentiality of this discharge information lies with you and/or your care-partner.  

## 2020-05-13 NOTE — Op Note (Signed)
[facility] Patient Name: Helen Miles Procedure Date: 05/13/2020 4:19 PM MRN: [id number] Endoscopist: Sherilyn Cooter L. Myrtie Neither , MD Age: 50 Referring MD:  Date of Birth: 21-Jul-1970 Gender: Female Account #: [id number] Procedure:                Colonoscopy Indications:              Screening for colorectal malignant neoplasm, This                            is the patient's first colonoscopy Medicines:                Monitored Anesthesia Care Procedure:                Pre-Anesthesia Assessment:                           - Prior to the procedure, a History and Physical                            was performed, and patient medications and                            allergies were reviewed. The patient's tolerance of                            previous anesthesia was also reviewed. The risks                            and benefits of the procedure and the sedation                            options and risks were discussed with the patient.                            All questions were answered, and informed consent                            was obtained. Prior Anticoagulants: The patient has                            taken no previous anticoagulant or antiplatelet                            agents. ASA Grade Assessment: III - A patient with                            severe systemic disease. After reviewing the risks                            and benefits, the patient was deemed in                            satisfactory condition to undergo the procedure.  After obtaining informed consent, the colonoscope                            was passed under direct vision. Throughout the                            procedure, the patient's blood pressure, pulse, and                            oxygen saturations were monitored continuously. The                            Olympus CF-HQ190 [id number]) Colonoscope was                            introduced through the  anus and advanced to the the                            cecum, identified by appendiceal orifice and                            ileocecal valve. The colonoscopy was somewhat                            difficult due to a redundant colon and significant                            looping. Successful completion of the procedure was                            aided by using manual pressure. The patient                            tolerated the procedure well. The quality of the                            bowel preparation was excellent. The ileocecal                            valve, appendiceal orifice, and rectum were                            photographed. The bowel preparation used was Plenvu. Scope In: 4:36:47 PM Scope Out: 4:52:53 PM Scope Withdrawal Time: 0 hours 10 minutes 16 seconds  Total Procedure Duration: 0 hours 16 minutes 6 seconds  Findings:                 The perianal and digital rectal examinations were                            normal.                           The entire examined colon appeared normal on direct  and retroflexion views. Complications:            No immediate complications. Estimated Blood Loss:     Estimated blood loss: none. Impression:               - The entire examined colon is normal on direct and                            retroflexion views.                           - No specimens collected. Recommendation:           - Patient has a contact number available for                            emergencies. The signs and symptoms of potential                            delayed complications were discussed with the                            patient. Return to normal activities tomorrow.                            Written discharge instructions were provided to the                            patient.                           - Resume previous diet.                           - Continue present medications.                            - Repeat colonoscopy in 10 years for screening                            purposes. Morell Mears L. Myrtie Neither, MD 05/13/2020 5:03:09 PM This report has been signed electronically.

## 2020-05-13 NOTE — Progress Notes (Signed)
VS taken by C.W. 

## 2020-05-13 NOTE — Progress Notes (Signed)
Report given to PACU, vss 

## 2020-05-17 ENCOUNTER — Telehealth: Payer: Self-pay

## 2020-05-17 NOTE — Telephone Encounter (Signed)
LVM

## 2020-08-05 ENCOUNTER — Other Ambulatory Visit: Payer: Self-pay

## 2020-08-06 ENCOUNTER — Encounter: Payer: Managed Care, Other (non HMO) | Admitting: Nurse Practitioner

## 2020-08-30 ENCOUNTER — Other Ambulatory Visit: Payer: Self-pay

## 2020-08-30 ENCOUNTER — Telehealth (INDEPENDENT_AMBULATORY_CARE_PROVIDER_SITE_OTHER): Payer: Managed Care, Other (non HMO) | Admitting: Family Medicine

## 2020-08-30 DIAGNOSIS — R059 Cough, unspecified: Secondary | ICD-10-CM | POA: Diagnosis not present

## 2020-08-30 DIAGNOSIS — R6889 Other general symptoms and signs: Secondary | ICD-10-CM | POA: Diagnosis not present

## 2020-08-30 MED ORDER — OSELTAMIVIR PHOSPHATE 75 MG PO CAPS: 75.0000 mg | ORAL_CAPSULE | Freq: Two times a day (BID) | ORAL | 0 refills | Status: DC

## 2020-08-30 MED ORDER — AZITHROMYCIN 250 MG PO TABS: ORAL_TABLET | ORAL | 0 refills | Status: DC

## 2020-08-30 NOTE — Assessment & Plan Note (Signed)
TAMIFLU X 5 DAYS  Tylenol prn chills and body aches covid tests neg x2

## 2020-08-30 NOTE — Assessment & Plan Note (Signed)
Bronchitis === z pak per orders Wheezes heard over phone  con't otc cough meds

## 2020-08-30 NOTE — Progress Notes (Signed)
he   MyChart Video Visit    Virtual Visit via Video Note   This visit type was conducted due to national recommendations for restrictions regarding the COVID-19 Pandemic (e.g. social distancing) in an effort to limit this patient's exposure and mitigate transmission in our community. This patient is at least at moderate risk for complications without adequate follow up. This format is felt to be most appropriate for this patient at this time. Physical exam was limited by quality of the video and audio technology used for the visit. Helen Miles was able to get the patient set up on a video visit.  Patient location: Home Patient and provider in visit Provider location: Office  I discussed the limitations of evaluation and management by telemedicine and the availability of in person appointments. The patient expressed understanding and agreed to proceed.  Visit Date: 08/30/2020  Today's healthcare provider: Donato Schultz, DO     Subjective:    Patient ID: Helen Miles, female    DOB: 1970-06-27, 50 y.o.   MRN: [id number]  Chief Complaint  Patient presents with   Cough   Nasal Congestion    HPI Patient is in today for nasal congestion , cough and chills since Friday night .   She has had 2 neg covid tests  Cough is productive and is yellow    no wheezing     pt has tried robitussin which helps some  She is sleeping ok       Past Medical History:  Diagnosis Date   Generalized headaches    Obesity     Past Surgical History:  Procedure Laterality Date   CESAREAN SECTION     G 3 P 2 ( C section X2)   WISDOM TOOTH EXTRACTION      Family History  Problem Relation Age of Onset   Heart attack Father 84   Lupus Mother        dermatologic   Lupus Brother        dermatologic   Hypertension Maternal Grandfather    Lung cancer Maternal Grandfather        smoker   Deep vein thrombosis Maternal Grandfather    Pulmonary embolism Other        5 maternal great  uncles   Hypothyroidism Other        PGGM   Diabetes Neg Hx    Stroke Neg Hx    Colon cancer Neg Hx    Esophageal cancer Neg Hx    Stomach cancer Neg Hx    Rectal cancer Neg Hx     Social History   Socioeconomic History   Marital status: Married    Spouse name: Not on file   Number of children: Not on file   Years of education: Not on file   Highest education level: Not on file  Occupational History   Not on file  Tobacco Use   Smoking status: Never   Smokeless tobacco: Never  Vaping Use   Vaping Use: Never used  Substance and Sexual Activity   Alcohol use: Not Currently   Drug use: No   Sexual activity: Yes    Birth control/protection: Surgical  Other Topics Concern   Not on file  Social History Narrative   Not on file   Social Determinants of Health   Financial Resource Strain: Not on file  Food Insecurity: Not on file  Transportation Needs: Not on file  Physical Activity: Not on file  Stress: Not on  file  Social Connections: Not on file  Intimate Partner Violence: Not on file    Outpatient Medications Prior to Visit  Medication Sig Dispense Refill   buPROPion (WELLBUTRIN XL) 150 MG 24 hr tablet Take 150 mg by mouth daily.     cyclobenzaprine (FLEXERIL) 10 MG tablet Take 1 tablet (10 mg total) by mouth at bedtime. (Patient taking differently: Take 10 mg by mouth daily as needed.) 14 tablet 0   ergocalciferol (VITAMIN D2) 1.25 MG (50000 UT) capsule ergocalciferol (vitamin D2) 1,250 mcg (50,000 unit) capsule  TAKE 1 CAPSULE BY MOUTH ONCE A WEEK     ibuprofen (ADVIL) 600 MG tablet Take 1 tablet (600 mg total) by mouth every 8 (eight) hours as needed for moderate pain (with food). 30 tablet 0   LINZESS 72 MCG capsule Take 72 mcg by mouth 2 (two) times daily as needed.     phentermine 37.5 MG capsule Take 37.5 mg by mouth every morning.     Semaglutide,0.25 or 0.5MG /DOS, (OZEMPIC, 0.25 OR 0.5 MG/DOSE,) 2 MG/1.5ML SOPN Ozempic 0.25 mg or 0.5 mg (2 mg/1.5 mL)  subcutaneous pen injector (Patient not taking: No sig reported)     No facility-administered medications prior to visit.    Allergies  Allergen Reactions   Penicillins Swelling    Angioedema of lip & tongue   Contrave [Naltrexone-Bupropion Hcl Er] Itching    Review of Systems  HENT:  Positive for congestion. Negative for ear pain, hearing loss and tinnitus.   Eyes:  Negative for blurred vision, double vision and photophobia.  Respiratory:  Positive for cough.   Gastrointestinal:  Negative for blood in stool, constipation and melena.  Genitourinary:  Negative for hematuria.  Musculoskeletal:  Positive for myalgias.      Objective:    Physical Exam Constitutional:      Appearance: Normal appearance.  Pulmonary:     Effort: Pulmonary effort is normal.  Neurological:     Mental Status: She is alert.  Psychiatric:        Behavior: Behavior normal.        Thought Content: Thought content normal.    There were no vitals taken for this visit. Wt Readings from Last 3 Encounters:  05/13/20 253 lb (114.8 kg)  04/08/20 253 lb (114.8 kg)  08/04/19 245 lb 6.4 oz (111.3 kg)    Diabetic Foot Exam - Simple   No data filed    Lab Results  Component Value Date   WBC 3.7 04/18/2019   HGB 12.9 04/18/2019   HCT 40.4 11/03/2016   PLT 228 04/18/2019   GLUCOSE 91 11/03/2016   CHOL 198 04/18/2019   TRIG 43 04/18/2019   HDL 76 (A) 04/18/2019   LDLCALC 116 (H) 11/03/2016   ALT 12 04/18/2019   AST 14 04/18/2019   NA 138 04/18/2019   K 4.2 04/18/2019   CL 106 04/18/2019   CREATININE 1.1 04/18/2019   BUN 14 04/18/2019   CO2 25 (A) 04/18/2019   TSH 1.86 11/03/2016   HGBA1C 4.9 04/18/2019    Lab Results  Component Value Date   TSH 1.86 11/03/2016   Lab Results  Component Value Date   WBC 3.7 04/18/2019   HGB 12.9 04/18/2019   HCT 40.4 11/03/2016   MCV 90.9 11/03/2016   PLT 228 04/18/2019   Lab Results  Component Value Date   NA 138 04/18/2019   K 4.2 04/18/2019    CO2 25 (A) 04/18/2019   GLUCOSE 91 11/03/2016  BUN 14 04/18/2019   CREATININE 1.1 04/18/2019   BILITOT 0.7 11/03/2016   ALKPHOS 58 04/18/2019   AST 14 04/18/2019   ALT 12 04/18/2019   PROT 7.2 11/03/2016   ALBUMIN 3.9 04/18/2019   CALCIUM 9.3 04/18/2019   ANIONGAP 7 08/05/2016   GFR 80.69 11/03/2016   Lab Results  Component Value Date   CHOL 198 04/18/2019   Lab Results  Component Value Date   HDL 76 (A) 04/18/2019   Lab Results  Component Value Date   LDLCALC 116 (H) 11/03/2016   Lab Results  Component Value Date   TRIG 43 04/18/2019   Lab Results  Component Value Date   CHOLHDL 3 11/03/2016   Lab Results  Component Value Date   HGBA1C 4.9 04/18/2019       Assessment & Plan:   Problem List Items Addressed This Visit       Unprioritized   Cough    Bronchitis === z pak per orders Wheezes heard over phone  con't otc cough meds       Relevant Medications   azithromycin (ZITHROMAX Z-PAK) 250 MG tablet   Flu-like symptoms - Primary    TAMIFLU X 5 DAYS  Tylenol prn chills and body aches covid tests neg x2        Relevant Medications   oseltamivir (TAMIFLU) 75 MG capsule     Meds ordered this encounter  Medications   oseltamivir (TAMIFLU) 75 MG capsule    Sig: Take 1 capsule (75 mg total) by mouth 2 (two) times daily.    Dispense:  10 capsule    Refill:  0   azithromycin (ZITHROMAX Z-PAK) 250 MG tablet    Sig: As directed    Dispense:  6 each    Refill:  0    I discussed the assessment and treatment plan with the patient. The patient was provided an opportunity to ask questions and all were answered. The patient agreed with the plan and demonstrated an understanding of the instructions.   The patient was advised to call back or seek an in-person evaluation if the symptoms worsen or if the condition fails to improve as anticipated.   Donato Schultz, DO [facility] [phone]  (phone) [phone] (fax)  [facility]

## 2020-08-31 ENCOUNTER — Encounter: Payer: Self-pay | Admitting: Family Medicine

## 2020-09-09 ENCOUNTER — Other Ambulatory Visit: Payer: Self-pay | Admitting: Obstetrics

## 2020-09-09 ENCOUNTER — Other Ambulatory Visit: Payer: Self-pay | Admitting: Obstetrics and Gynecology

## 2020-09-09 DIAGNOSIS — R928 Other abnormal and inconclusive findings on diagnostic imaging of breast: Secondary | ICD-10-CM

## 2020-09-13 ENCOUNTER — Encounter: Payer: Self-pay | Admitting: Nurse Practitioner

## 2020-09-13 ENCOUNTER — Encounter: Payer: Managed Care, Other (non HMO) | Admitting: Nurse Practitioner

## 2020-09-20 ENCOUNTER — Other Ambulatory Visit: Payer: Self-pay

## 2020-09-20 ENCOUNTER — Encounter: Payer: Self-pay | Admitting: Family Medicine

## 2020-09-20 ENCOUNTER — Ambulatory Visit (INDEPENDENT_AMBULATORY_CARE_PROVIDER_SITE_OTHER): Payer: Managed Care, Other (non HMO) | Admitting: Family Medicine

## 2020-09-20 VITALS — BP 120/80 | HR 62 | Temp 97.7°F | Wt 247.0 lb

## 2020-09-20 DIAGNOSIS — R0789 Other chest pain: Secondary | ICD-10-CM | POA: Diagnosis not present

## 2020-09-20 NOTE — Progress Notes (Signed)
[facility] [address] [place] [address] Dept: [phone] Dept Fax: [phone]  Office Visit  Subjective:    Patient ID: Helen Miles, female    DOB: Jun 13, 1970, 50 y.o..   MRN: [id number]  Chief Complaint  Patient presents with   Follow-up    Pt c/o dull pain under left clavical area, with pressure x2 weeks.    History of Present Illness:  Patient is in today for evaluation of a left chest wall pain. She notes this has been present for 1-2 weeks. Ms. Bai notes she had a COVID-19 infection about 4 weeks ago. She admits to significant cough  at that time. After she was recovering, she noted the chest wall pain. She does not recall any trauma to this area. She has been out of the gym since being sick, so knows her weight lifting has not been part of this. She is not coughing recently. She has not done anything in particular to try and treat the pain.  Past Medical History: Patient Active Problem List   Diagnosis Date Noted   Flu-like symptoms 08/30/2020   Cough 08/30/2020   Special screening for malignant neoplasms, colon 04/08/2020   Family history of colonic polyps 08/04/2019   Headache(784.0) 06/03/2009   TACHYCARDIA, HX OF 06/03/2009   Obesity, morbid (HCC) 11/12/2008   HIATAL HERNIA 10/23/2007   HYPERLIPIDEMIA 12/06/2006   Past Surgical History:  Procedure Laterality Date   CESAREAN SECTION     G 3 P 2 ( C section X2)   WISDOM TOOTH EXTRACTION     Family History  Problem Relation Age of Onset   Heart attack Father 47   Lupus Mother        dermatologic   Lupus Brother        dermatologic   Hypertension Maternal Grandfather    Lung cancer Maternal Grandfather        smoker   Deep vein thrombosis Maternal Grandfather    Pulmonary embolism Other        5 maternal great uncles   Hypothyroidism Other        PGGM   Diabetes Neg Hx    Stroke Neg Hx    Colon cancer Neg Hx    Esophageal  cancer Neg Hx    Stomach cancer Neg Hx    Rectal cancer Neg Hx    Outpatient Medications Prior to Visit  Medication Sig Dispense Refill   azithromycin (ZITHROMAX Z-PAK) 250 MG tablet As directed 6 each 0   buPROPion (WELLBUTRIN XL) 150 MG 24 hr tablet Take 150 mg by mouth daily.     cyclobenzaprine (FLEXERIL) 10 MG tablet Take 1 tablet (10 mg total) by mouth at bedtime. (Patient taking differently: Take 10 mg by mouth daily as needed.) 14 tablet 0   ergocalciferol (VITAMIN D2) 1.25 MG (50000 UT) capsule ergocalciferol (vitamin D2) 1,250 mcg (50,000 unit) capsule  TAKE 1 CAPSULE BY MOUTH ONCE A WEEK     ibuprofen (ADVIL) 600 MG tablet Take 1 tablet (600 mg total) by mouth every 8 (eight) hours as needed for moderate pain (with food). 30 tablet 0   LINZESS 72 MCG capsule Take 72 mcg by mouth 2 (two) times daily as needed.     oseltamivir (TAMIFLU) 75 MG capsule Take 1 capsule (75 mg total) by mouth 2 (two) times daily. 10 capsule 0   phentermine 37.5 MG capsule Take 37.5 mg by mouth every morning.     phentermine (  ADIPEX-P) 37.5 MG tablet Take 56.25 mg by mouth daily.     No facility-administered medications prior to visit.   Allergies  Allergen Reactions   Penicillins Swelling    Angioedema of lip & tongue   Contrave [Naltrexone-Bupropion Hcl Er] Itching     Objective:   Today's Vitals   09/20/20 1538  BP: 120/80  Pulse: 62  Temp: 97.7 F (36.5 C)  TempSrc: Temporal  SpO2: 92%  Weight: 247 lb (112 kg)   Body mass index is 46.67 kg/m.   General: Well developed, well nourished. No acute distress. Chest: There is point tenderness to palpation over the left upper chest wall. No swelling or redness noted. Lungs: Clear to auscultation bilaterally. No wheezing, rales or rhonchi. CV: RRR without murmurs or rubs. Pulses 2+ bilaterally. Psych: Alert and oriented. Normal mood and affect.  Health Maintenance Due  Topic Date Due   HIV Screening  Never done   Hepatitis C Screening   Never done   COVID-19 Vaccine (3 - Booster for Pfizer series) 10/03/2019   INFLUENZA VACCINE  09/13/2020     Assessment & Plan:   1. Chest wall pain Apply moist heat for 20 min. twice a day Stretch chest muscles while using heat. Take Aleve 220 mg twice a day for 7 days. Follow-up if not improving.   Loyola Mast, MD

## 2020-09-20 NOTE — Patient Instructions (Addendum)
Apply moist heat for 20 min. twice a day Stretch chest muscles while using heat. Take Aleve 220 mg twice a day for 7 days.

## 2020-09-23 ENCOUNTER — Other Ambulatory Visit: Payer: Self-pay | Admitting: Obstetrics

## 2020-09-23 DIAGNOSIS — R928 Other abnormal and inconclusive findings on diagnostic imaging of breast: Secondary | ICD-10-CM

## 2020-10-04 ENCOUNTER — Other Ambulatory Visit: Payer: Self-pay | Admitting: Obstetrics

## 2020-10-04 ENCOUNTER — Ambulatory Visit
Admission: RE | Admit: 2020-10-04 | Discharge: 2020-10-04 | Disposition: A | Discharge status: Home / Self Care | Payer: Managed Care, Other (non HMO) | Source: Ambulatory Visit | Attending: Obstetrics and Gynecology | Admitting: Obstetrics and Gynecology

## 2020-10-04 ENCOUNTER — Other Ambulatory Visit: Payer: Self-pay

## 2020-10-04 ENCOUNTER — Ambulatory Visit
Admission: RE | Admit: 2020-10-04 | Discharge: 2020-10-04 | Disposition: A | Discharge status: Home / Self Care | Payer: Managed Care, Other (non HMO) | Source: Ambulatory Visit | Attending: Obstetrics | Admitting: Obstetrics

## 2020-10-04 DIAGNOSIS — R928 Other abnormal and inconclusive findings on diagnostic imaging of breast: Secondary | ICD-10-CM

## 2020-11-08 ENCOUNTER — Ambulatory Visit (INDEPENDENT_AMBULATORY_CARE_PROVIDER_SITE_OTHER): Payer: Managed Care, Other (non HMO) | Admitting: Nurse Practitioner

## 2020-11-08 ENCOUNTER — Encounter: Payer: Self-pay | Admitting: Nurse Practitioner

## 2020-11-08 ENCOUNTER — Other Ambulatory Visit: Payer: Self-pay

## 2020-11-08 VITALS — BP 126/82 | HR 84 | Temp 97.5°F | Ht 62.0 in | Wt 252.8 lb

## 2020-11-08 DIAGNOSIS — Z23 Encounter for immunization: Secondary | ICD-10-CM | POA: Diagnosis not present

## 2020-11-08 DIAGNOSIS — E782 Mixed hyperlipidemia: Secondary | ICD-10-CM | POA: Diagnosis not present

## 2020-11-08 DIAGNOSIS — Z0000 Encounter for general adult medical examination without abnormal findings: Secondary | ICD-10-CM

## 2020-11-08 NOTE — Patient Instructions (Addendum)
Schedule fasting lab appt. Need to be fasting 8hrs prior to blood draw. Ok to drink water.  Sign medical release to get lab results, ECG and echocardiogram from [facility].  [facility] [phone]  Calorie Counting for Weight Loss Calories are units of energy. Your body needs a certain number of calories from food to keep going throughout the day. When you eat or drink more calories than your body needs, your body stores the extra calories mostly as fat. When you eat or drink fewer calories than your body needs, your body burns fat to get the energy it needs. Calorie counting means keeping track of how many calories you eat and drink each day. Calorie counting can be helpful if you need to lose weight. If you eat fewer calories than your body needs, you should lose weight. Ask your health care provider what a healthy weight is for you. For calorie counting to work, you will need to eat the right number of calories each day to lose a healthy amount of weight per week. A dietitian can help you figure out how many calories you need in a day and will suggest ways to reach your calorie goal. A healthy amount of weight to lose each week is usually 1-2 lb (0.5-0.9 kg). This usually means that your daily calorie intake should be reduced by 500-750 calories. Eating 1,200-1,500 calories a day can help most women lose weight. Eating 1,500-1,800 calories a day can help most men lose weight. What do I need to know about calorie counting? Work with your health care provider or dietitian to determine how many calories you should get each day. To meet your daily calorie goal, you will need to: Find out how many calories are in each food that you would like to eat. Try to do this before you eat. Decide how much of the food you plan to eat. Keep a food log. Do this by writing down what you ate and how many calories it had. To successfully lose weight, it is important to  balance calorie counting with a healthy lifestyle that includes regular activity. Where do I find calorie information? The number of calories in a food can be found on a Nutrition Facts label. If a food does not have a Nutrition Facts label, try to look up the calories online or ask your dietitian for help. Remember that calories are listed per serving. If you choose to have more than one serving of a food, you will have to multiply the calories per serving by the number of servings you plan to eat. For example, the label on a package of bread might say that a serving size is 1 slice and that there are 90 calories in a serving. If you eat 1 slice, you will have eaten 90 calories. If you eat 2 slices, you will have eaten 180 calories. How do I keep a food log? After each time that you eat, record the following in your food log as soon as possible: What you ate. Be sure to include toppings, sauces, and other extras on the food. How much you ate. This can be measured in cups, ounces, or number of items. How many calories were in each food and drink. The total number of calories in the food you ate. Keep your food log near you, such as in a pocket-sized notebook or on an app or website on your mobile phone. Some programs will calculate calories for you and  show you how many calories you have left to meet your daily goal. What are some portion-control tips? Know how many calories are in a serving. This will help you know how many servings you can have of a certain food. Use a measuring cup to measure serving sizes. You could also try weighing out portions on a kitchen scale. With time, you will be able to estimate serving sizes for some foods. Take time to put servings of different foods on your favorite plates or in your favorite bowls and cups so you know what a serving looks like. Try not to eat straight from a food's packaging, such as from a bag or box. Eating straight from the package makes it hard  to see how much you are eating and can lead to overeating. Put the amount you would like to eat in a cup or on a plate to make sure you are eating the right portion. Use smaller plates, glasses, and bowls for smaller portions and to prevent overeating. Try not to multitask. For example, avoid watching TV or using your computer while eating. If it is time to eat, sit down at a table and enjoy your food. This will help you recognize when you are full. It will also help you be more mindful of what and how much you are eating. What are tips for following this plan? Reading food labels Check the calorie count compared with the serving size. The serving size may be smaller than what you are used to eating. Check the source of the calories. Try to choose foods that are high in protein, fiber, and vitamins, and low in saturated fat, trans fat, and sodium. Shopping Read nutrition labels while you shop. This will help you make healthy decisions about which foods to buy. Pay attention to nutrition labels for low-fat or fat-free foods. These foods sometimes have the same number of calories or more calories than the full-fat versions. They also often have added sugar, starch, or salt to make up for flavor that was removed with the fat. Make a grocery list of lower-calorie foods and stick to it. Cooking Try to cook your favorite foods in a healthier way. For example, try baking instead of frying. Use low-fat dairy products. Meal planning Use more fruits and vegetables. One-half of your plate should be fruits and vegetables. Include lean proteins, such as chicken, [place], and fish. Lifestyle Each week, aim to do one of the following: 150 minutes of moderate exercise, such as walking. 75 minutes of vigorous exercise, such as running. General information Know how many calories are in the foods you eat most often. This will help you calculate calorie counts faster. Find a way of tracking calories that works for  you. Get creative. Try different apps or programs if writing down calories does not work for you. What foods should I eat?  Eat nutritious foods. It is better to have a nutritious, high-calorie food, such as an avocado, than a food with few nutrients, such as a bag of potato chips. Use your calories on foods and drinks that will fill you up and will not leave you hungry soon after eating. Examples of foods that fill you up are nuts and nut butters, vegetables, lean proteins, and high-fiber foods such as whole grains. High-fiber foods are foods with more than 5 g of fiber per serving. Pay attention to calories in drinks. Low-calorie drinks include water and unsweetened drinks. The items listed above may not be a complete list  of foods and beverages you can eat. Contact a dietitian for more information. What foods should I limit? Limit foods or drinks that are not good sources of vitamins, minerals, or protein or that are high in unhealthy fats. These include: Candy. Other sweets. Sodas, specialty coffee drinks, alcohol, and juice. The items listed above may not be a complete list of foods and beverages you should avoid. Contact a dietitian for more information. How do I count calories when eating out? Pay attention to portions. Often, portions are much larger when eating out. Try these tips to keep portions smaller: Consider sharing a meal instead of getting your own. If you get your own meal, eat only half of it. Before you start eating, ask for a container and put half of your meal into it. When available, consider ordering smaller portions from the menu instead of full portions. Pay attention to your food and drink choices. Knowing the way food is cooked and what is included with the meal can help you eat fewer calories. If calories are listed on the menu, choose the lower-calorie options. Choose dishes that include vegetables, fruits, whole grains, low-fat dairy products, and lean  proteins. Choose items that are boiled, broiled, grilled, or steamed. Avoid items that are buttered, battered, fried, or served with cream sauce. Items labeled as crispy are usually fried, unless stated otherwise. Choose water, low-fat milk, unsweetened iced tea, or other drinks without added sugar. If you want an alcoholic beverage, choose a lower-calorie option, such as a glass of wine or light beer. Ask for dressings, sauces, and syrups on the side. These are usually high in calories, so you should limit the amount you eat. If you want a salad, choose a garden salad and ask for grilled meats. Avoid extra toppings such as bacon, cheese, or fried items. Ask for the dressing on the side, or ask for olive oil and vinegar or lemon to use as dressing. Estimate how many servings of a food you are given. Knowing serving sizes will help you be aware of how much food you are eating at restaurants. Where to find more information Centers for Disease Control and Prevention: FootballExhibition.com.br [place]. Department of Agriculture: WrestlingReporter.dk Summary Calorie counting means keeping track of how many calories you eat and drink each day. If you eat fewer calories than your body needs, you should lose weight. A healthy amount of weight to lose per week is usually 1-2 lb (0.5-0.9 kg). This usually means reducing your daily calorie intake by 500-750 calories. The number of calories in a food can be found on a Nutrition Facts label. If a food does not have a Nutrition Facts label, try to look up the calories online or ask your dietitian for help. Use smaller plates, glasses, and bowls for smaller portions and to prevent overeating. Use your calories on foods and drinks that will fill you up and not leave you hungry shortly after a meal. This information is not intended to replace advice given to you by your health care provider. Make sure you discuss any questions you have with your health care provider. Document Revised:  03/13/2019 Document Reviewed: 03/13/2019 Protein Content in Foods Protein is a necessary nutrient in any diet. It helps build and repair muscles, bones, and skin. Depending on your overall health, you may need more or less protein in your diet. You are encouraged to eat a variety of protein foods to ensure that you get all the essential nutrients that are found in different  protein foods. Talk with your health care provider or dietitian about how much protein you need each day and which sources of protein are best for you. Protein is especially important for: Repairing and making cells and tissues. Fighting infection. Providing energy. Growth and development. See the following list for the protein content of some common foods. What are tips for getting more protein in your diet? Try to replace processed carbohydrates with high-quality protein. Snack on nuts and seeds instead of chips. Replace baked desserts with [place] yogurt. Eat protein foods from both plant and animal sources. Replace red meat with seafood. Add beans and peas to salads, soups, and side dishes. Include a protein food with each meal and snack. Reading food labels You can find the amount of protein in a food item by looking at the nutrition facts label. Use the total grams listed to help you reach your daily goal. What foods are high in protein? High-protein foods contain 4 grams (g) or more of protein per serving. They include: Grains Quinoa (cooked) -- 1 cup (185 g) has 8 g of protein. Whole wheat pasta (cooked) -- 1 cup (140 g) has 6 g of protein. Meat Beef, ground sirloin (cooked) -- 3 oz (85 g) has 24 g of protein. Chicken breast, boneless and skinless (cooked) -- 3 oz (85 g) has 25 g of protein. Egg -- 1 egg has 6 g of protein. Fish, filet (cooked) -- 1 oz (28 g) has 6-7 g of protein. Lamb (cooked) -- 3 oz (85 g) has 24 g of protein. Pork tenderloin (cooked) -- 3 oz (85 g) has 23 g of protein. Tuna (canned in  water) -- 3 oz (85 g) has 20 g of protein. Dairy Cottage cheese --  cup (114 g) has 13.4 g of protein. Milk -- 1 cup (237 mL) has 8 g of protein. Cheese (hard) -- 1 oz (28 g) has 7 g of protein. Yogurt, regular -- 6 oz (170 g) has 8 g of protein. Greek yogurt -- 6 oz (200 g) has 18 g protein. Plant protein Garbanzo beans (canned or cooked) --  cup (130 g) has 6-7 g of protein. Kidney beans (canned or cooked) --  cup (130 g) has 6-7 g of protein. Nuts (peanuts, pistachios, almonds) -- 1 oz (28 g) has 6 g of protein. Peanut butter -- 1 oz (32 g) has 7-8 g of protein. Pumpkin seeds -- 1 oz (28 g) has 8.5 g of protein. Soybeans (roasted) -- 1 oz (28 g) has 8 g of protein. Soybeans (cooked) --  cup (90 g) has 11 g of protein. Soy milk -- 1 cup (250 mL) has 5-10 g of protein. Soy or vegetable patty -- 1 patty has 11 g of protein. Sunflower seeds -- 1 oz (28 g) has 5.5 g of protein. Buckwheat -- 1 oz (33 g) has 4.3 g of protein. Tofu (firm) --  cup (124 g) has 20 g of protein. Tempeh --  cup (83 g) has 16 g of protein. The items listed above may not be a complete list of foods high in protein. Actual amounts of protein may differ depending on processing. Contact a dietitian for more information. What foods are low in protein? Low-protein foods contain 3 grams (g) or less of protein per serving. They include: Fruits Fruit or vegetable juice --  cup (125 mL) has 1 g of protein. Vegetables Beets (raw or cooked) --  cup (68 g) has 1.5 g of protein. Broccoli (raw  or cooked) --  cup (44 g) has 2 g of protein. Collard greens (raw or cooked) --  cup (42 g) has 2 g of protein. Green beans (raw or cooked) --  cup (83 g) has 1 g of protein. Green peas (canned) --  cup (80 g) has 3.5 g of protein. Potato (baked with skin) -- 1 medium potato (173 g) has 3 g of protein. Spinach (cooked) --  cup (90 g) has 3 g of protein. Squash (cooked) --  cup (90 g) has 1.5 g of protein. Avocado -- 1  cup (146 g) has 2.7 g of protein. Grains Bran cereal --  cup (30 g) has 2-3 g of protein. Bread -- 1 slice has 2.5 g of protein. Corn (fresh or cooked) --  cup (77 g) has 2 g of protein. Flour tortilla -- One 6-inch (15 cm) tortilla has 2.5 g of protein. Muffins -- 1 small muffin (2 oz or 57 g) has 3 g of protein. Oatmeal (cooked) --  cup (40 g) has 3 g of protein. Rice (cooked) --  cup (79 g) has 2.5-3.5 g of protein. Dairy Cream cheese -- 1 oz (29 g) has 2 g of protein. Creamer (half-and-half) -- 1 oz (29 mL) has 1 g of protein. Frozen yogurt --  cup (72 g) has 3 g of protein. Sour cream --  cup (75 g) has 2.5 g of protein. The items listed above may not be a complete list of foods low in protein. Actual amounts of protein may differ depending on processing. Contact a dietitian for more information. Summary Protein is a nutrient that your body needs for growth and development, repairing and making cells and tissues, fighting infection, and providing energy. Protein is in both plant and animal foods. Some of these foods have more protein than others. Depending on your overall health, you may need more or less protein in your diet. Talk to your health care provider about how much protein you need. This information is not intended to replace advice given to you by your health care provider. Make sure you discuss any questions you have with your health care provider. Document Revised: 01/05/2020 Document Reviewed: 01/05/2020 Elsevier Patient Education  2022 [employer].  Elsevier Patient Education  2022 [employer].

## 2020-11-08 NOTE — Assessment & Plan Note (Signed)
Continues to have difficulty with weight loss journey despite trying multiple diets (calorie counting, intermittent fasting, low carb/low sugar, and high protein) , daily exercise (weight training and cardio 5x/week), and current use of phentermine, and wellbutrin. Medications prescribed by [facility] per patient. Last OV 03/2020. She reports ECG and echocardiogram was obtained.  Weight loss goal is to be under 200lbs Lowest weight in college: cloth size 13 (unknown weight), current cloth size 20 FHx of morbid obesity: PGM, and aunts BP Readings from Last 3 Encounters:  11/08/20 126/82  09/20/20 120/80  05/13/20 (!) 145/90   Wt Readings from Last 3 Encounters:  11/08/20 252 lb 12.8 oz (114.7 kg)  09/20/20 247 lb (112 kg)  05/13/20 253 lb (114.8 kg)   Advised about need of nutritionist app. She states she tried diet provided by nutritionist and coaching with psychologist and there was no improvement. She is interested in bariatric Surgery. Provided number to [facility]

## 2020-11-08 NOTE — Assessment & Plan Note (Signed)
Repeat lipid panel ?

## 2020-11-08 NOTE — Progress Notes (Signed)
Subjective:    Patient ID: Helen Miles, female    DOB: [birth year], 50 y.o.   MRN: [id number]  Patient presents today for CPE   HPI Obesity, morbid (HCC) Continues to have difficulty with weight loss journey despite trying multiple diets (calorie counting, intermittent fasting, low carb/low sugar, and high protein) , daily exercise (weight training and cardio 5x/week), and current use of phentermine, and wellbutrin. Medications prescribed by [facility] per patient. Last OV 03/2020. She reports ECG and echocardiogram was obtained.  Weight loss goal is to be under 200lbs Lowest weight in college: cloth size 13 (unknown weight), current cloth size 20 FHx of morbid obesity: PGM, and aunts BP Readings from Last 3 Encounters:  11/08/20 126/82  09/20/20 120/80  05/13/20 (!) 145/90   Wt Readings from Last 3 Encounters:  11/08/20 252 lb 12.8 oz (114.7 kg)  09/20/20 247 lb (112 kg)  05/13/20 253 lb (114.8 kg)   Advised about need of nutritionist app. She states she tried diet provided by nutritionist and coaching with psychologist and there was no improvement. She is interested in bariatric Surgery. Provided number to [facility]  HYPERLIPIDEMIA Repeat lipid panel  Labs completed by [facility] (reviewed via patient's portal) 03/2020: TSH 1.55, vit. D 42.61, Hgba1c 5.1%, PTH 64.5, CBC normal, CMP normal, Lipid panel (elevated LDL)  Vision:up to date, use of corrective lens Dental:up to date  Sexual History (orientation,birth control, marital status, STD):up to date with pap smear, mammogram and colonoscopy.  Depression/Suicide: Depression screen [facility] 2/9 11/08/2020 08/04/2019 11/03/2016  Decreased Interest 0 0 0  Down, Depressed, Hopeless 0 0 0  PHQ - 2 Score 0 0 0  Altered sleeping 0 - -  Tired, decreased energy 1 - -  Change in appetite 0 - -  Feeling bad or failure about yourself  0 - -  Trouble concentrating 1 - -  Moving  slowly or fidgety/restless 0 - -  Suicidal thoughts 0 - -  PHQ-9 Score 2 - -  Difficult doing work/chores Not difficult at all - -   Immunizations: (TDAP, Hep C screen, Pneumovax, Influenza, zoster)  Health Maintenance  Topic Date Due   COVID-19 Vaccine (3 - Booster for Pfizer series) 10/03/2019   Hepatitis C Screening: USPSTF Recommendation to screen - Ages 18-79 yo.  11/08/2021*   HIV Screening  11/08/2021*   Pap Smear  07/28/2022   Tetanus Vaccine  08/03/2029   Colon Cancer Screening  05/14/2030   Flu Shot  Completed   HPV Vaccine  Aged Out  *Topic was postponed. The date shown is not the original due date.   Fall Risk: Fall Risk  08/04/2019  Falls in the past year? 0  Number falls in past yr: 0  Injury with Fall? 0   Advanced Directive: Advanced Directives 08/05/2016  Does Patient Have a Medical Advance Directive? No    Medications and allergies reviewed with patient and updated if appropriate.  Patient Active Problem List   Diagnosis Date Noted   Flu-like symptoms 08/30/2020   Cough 08/30/2020   Special screening for malignant neoplasms, colon 04/08/2020   Family history of colonic polyps 08/04/2019   Headache(784.0) 06/03/2009   TACHYCARDIA, HX OF 06/03/2009   Obesity, morbid (HCC) 11/12/2008   HIATAL HERNIA 10/23/2007   HYPERLIPIDEMIA 12/06/2006    Current Outpatient Medications on File Prior to Visit  Medication Sig Dispense Refill   buPROPion (WELLBUTRIN XL) 150 MG 24 hr tablet Take 150 mg by  mouth daily.     ergocalciferol (VITAMIN D2) 1.25 MG (50000 UT) capsule ergocalciferol (vitamin D2) 1,250 mcg (50,000 unit) capsule  TAKE 1 CAPSULE BY MOUTH ONCE A WEEK     ibuprofen (ADVIL) 600 MG tablet Take 1 tablet (600 mg total) by mouth every 8 (eight) hours as needed for moderate pain (with food). 30 tablet 0   phentermine (ADIPEX-P) 37.5 MG tablet Take 1-1.5 tablets by mouth daily.     No current facility-administered medications on file prior to visit.     Past Medical History:  Diagnosis Date   Generalized headaches    Obesity     Past Surgical History:  Procedure Laterality Date   CESAREAN SECTION     G 3 P 2 ( C section X2)   WISDOM TOOTH EXTRACTION      Social History   Socioeconomic History   Marital status: Married    Spouse name: Not on file   Number of children: Not on file   Years of education: Not on file   Highest education level: Not on file  Occupational History   Not on file  Tobacco Use   Smoking status: Never   Smokeless tobacco: Never  Vaping Use   Vaping Use: Never used  Substance and Sexual Activity   Alcohol use: Not Currently   Drug use: No   Sexual activity: Yes    Birth control/protection: Other-see comments    Comment: husband with vasectomy  Other Topics Concern   Not on file  Social History Narrative   Not on file   Social Determinants of Health   Financial Resource Strain: Not on file  Food Insecurity: Not on file  Transportation Needs: Not on file  Physical Activity: Not on file  Stress: Not on file  Social Connections: Not on file    Family History  Problem Relation Age of Onset   Heart attack Father 5   Lupus Mother        dermatologic   Lupus Brother        dermatologic   Hypertension Maternal Grandfather    Lung cancer Maternal Grandfather        smoker   Deep vein thrombosis Maternal Grandfather    Pulmonary embolism Other        5 maternal great uncles   Hypothyroidism Other        PGGM   Diabetes Neg Hx    Stroke Neg Hx    Colon cancer Neg Hx    Esophageal cancer Neg Hx    Stomach cancer Neg Hx    Rectal cancer Neg Hx         Review of Systems  Constitutional:  Negative for fever, malaise/fatigue and weight loss.  HENT:  Negative for congestion and sore throat.   Eyes:        Negative for visual changes  Respiratory:  Negative for cough and shortness of breath.   Cardiovascular:  Negative for chest pain, palpitations and leg swelling.   Gastrointestinal:  Negative for blood in stool, constipation, diarrhea and heartburn.  Genitourinary:  Negative for dysuria, frequency and urgency.  Musculoskeletal:  Negative for falls, joint pain and myalgias.  Skin:  Negative for rash.  Neurological:  Negative for dizziness, sensory change and headaches.  Endo/Heme/Allergies:  Does not bruise/bleed easily.  Psychiatric/Behavioral:  Negative for depression, substance abuse and suicidal ideas. The patient is not nervous/anxious and does not have insomnia.    Objective:   Vitals:   11/08/20  1312  BP: 126/82  Pulse: 84  Temp: (!) 97.5 F (36.4 C)  SpO2: 98%   Body mass index is 46.24 kg/m.  Physical Examination:  Physical Exam Vitals reviewed.  Constitutional:      General: She is not in acute distress.    Appearance: She is obese.  HENT:     Right Ear: Tympanic membrane, ear canal and external ear normal.     Left Ear: Tympanic membrane, ear canal and external ear normal.  Eyes:     General: No scleral icterus.    Extraocular Movements: Extraocular movements intact.     Conjunctiva/sclera: Conjunctivae normal.  Cardiovascular:     Rate and Rhythm: Normal rate and regular rhythm.     Pulses: Normal pulses.     Heart sounds: Normal heart sounds.  Pulmonary:     Effort: Pulmonary effort is normal. No respiratory distress.     Breath sounds: Normal breath sounds.  Abdominal:     General: Bowel sounds are normal. There is no distension.     Palpations: Abdomen is soft.  Genitourinary:    Comments: Deferred breast and pelvic exam to GYN Musculoskeletal:        General: Normal range of motion.     Cervical back: Normal range of motion and neck supple.     Right lower leg: No edema.     Left lower leg: No edema.  Lymphadenopathy:     Cervical: No cervical adenopathy.  Skin:    General: Skin is warm and dry.  Neurological:     Mental Status: She is alert and oriented to person, place, and time.  Psychiatric:         Behavior: Behavior normal.    ASSESSMENT and PLAN: This visit occurred during the SARS-CoV-2 public health emergency.  Safety protocols were in place, including screening questions prior to the visit, additional usage of staff PPE, and extensive cleaning of exam room while observing appropriate contact time as indicated for disinfecting solutions.   Helen Miles was seen today for annual exam.  Diagnoses and all orders for this visit:  Preventative health care  Flu vaccine need -     Flu Vaccine QUAD 6+ mos PF IM (Fluarix Quad PF)  HYPERLIPIDEMIA -     Lipid panel; Future  Obesity, morbid (HCC)    Sign medical release to get lab results, ECG and echocardiogram from [facility]. [facility] [phone]  Problem List Items Addressed This Visit       Other   HYPERLIPIDEMIA    Repeat lipid panel      Relevant Orders   Lipid panel   Obesity, morbid (HCC)    Continues to have difficulty with weight loss journey despite trying multiple diets (calorie counting, intermittent fasting, low carb/low sugar, and high protein) , daily exercise (weight training and cardio 5x/week), and current use of phentermine, and wellbutrin. Medications prescribed by [facility] per patient. Last OV 03/2020. She reports ECG and echocardiogram was obtained.  Weight loss goal is to be under 200lbs Lowest weight in college: cloth size 13 (unknown weight), current cloth size 20 FHx of morbid obesity: PGM, and aunts BP Readings from Last 3 Encounters:  11/08/20 126/82  09/20/20 120/80  05/13/20 (!) 145/90   Wt Readings from Last 3 Encounters:  11/08/20 252 lb 12.8 oz (114.7 kg)  09/20/20 247 lb (112 kg)  05/13/20 253 lb (114.8 kg)   Advised about need of nutritionist app.  She states she tried diet provided by nutritionist and coaching with psychologist and there was no improvement. She is interested in bariatric Surgery. Provided number to [facility]      Other Visit Diagnoses     Preventative health care    -  Primary   Flu vaccine need       Relevant Orders   Flu Vaccine QUAD 6+ mos PF IM (Fluarix Quad PF) (Completed)       Follow up: Return in about 1 year (around 11/08/2021) for CPE (fasting).  Alysia Penna, NP

## 2020-12-03 ENCOUNTER — Other Ambulatory Visit (INDEPENDENT_AMBULATORY_CARE_PROVIDER_SITE_OTHER): Payer: Managed Care, Other (non HMO)

## 2020-12-03 ENCOUNTER — Other Ambulatory Visit: Payer: Self-pay

## 2020-12-03 DIAGNOSIS — E782 Mixed hyperlipidemia: Secondary | ICD-10-CM

## 2020-12-03 LAB — LIPID PANEL
Cholesterol: 208 mg/dL — ABNORMAL HIGH (ref 0–200)
HDL: 59 mg/dL (ref >39.00)
LDL Cholesterol: 140 mg/dL — ABNORMAL HIGH (ref 0–99)
NonHDL: 148.68
Total CHOL/HDL Ratio: 4
Triglycerides: 43 mg/dL (ref 0.0–149.0)
VLDL: 8.6 mg/dL (ref 0.0–40.0)

## 2020-12-08 ENCOUNTER — Telehealth: Payer: Self-pay | Admitting: Nurse Practitioner

## 2020-12-17 ENCOUNTER — Other Ambulatory Visit: Payer: Self-pay

## 2020-12-17 ENCOUNTER — Ambulatory Visit (INDEPENDENT_AMBULATORY_CARE_PROVIDER_SITE_OTHER): Payer: No Typology Code available for payment source | Admitting: Primary Care

## 2020-12-17 ENCOUNTER — Ambulatory Visit (INDEPENDENT_AMBULATORY_CARE_PROVIDER_SITE_OTHER)
Admission: RE | Admit: 2020-12-17 | Discharge: 2020-12-17 | Disposition: A | Discharge status: Home / Self Care | Payer: No Typology Code available for payment source | Source: Ambulatory Visit | Attending: Primary Care | Admitting: Primary Care

## 2020-12-17 ENCOUNTER — Encounter: Payer: Self-pay | Admitting: Primary Care

## 2020-12-17 DIAGNOSIS — M25512 Pain in left shoulder: Secondary | ICD-10-CM | POA: Diagnosis not present

## 2020-12-17 NOTE — Patient Instructions (Signed)
Complete xray(s) prior to leaving today. I will notify you of your results once received.  You will be contacted regarding your referral to physical therapy.  Please let us know if you have not been contacted within two weeks.   You can apply diclofenac (Voltaren) Gel as needed.  Refrain from lifting weights.   Please update Helen Miles with any changes.  It was a pleasure meeting you!

## 2020-12-17 NOTE — Progress Notes (Signed)
Subjective:    Patient ID: Helen Miles, female    DOB: [birth year], 50 y.o.   MRN: [id number]  HPI  Helen Miles is a very pleasant 50 y.o. female patient of Claris Gower who presents today to discuss shoulder pain.  Three months ago she developed left upper chest wall pain that began after Covid-19 infection. Evaluated by Dr. Veto Kemps at the time, was told to take Ibuprofen, has had no resolve.   Approximately 6 weeks ago she began to develop pain to the left anterior shoulder and upper extremity with left upper extremity weakness, numbness to fingers of bilateral hands, and decreased ability to make a fist with her left hand. She has noticed joint swelling to her fingers bilaterally.   She has been lifting weights routinely for the last year, she did increase her weight amount over the last few months. She can exercise and lift weights without pain or difficulty.   She has to really focus at times to raise her left upper extremity past the height of her shoulder.   She denies a traumatic event, right upper extremity symptoms, neck pain, numbness to left upper extremity (except for hands), family history of autoimmune arthritis, erythema.        Review of Systems  Musculoskeletal:  Positive for arthralgias and joint swelling. Negative for back pain and neck pain.  Neurological:  Positive for weakness and numbness.        Past Medical History:  Diagnosis Date   Generalized headaches    Obesity     Social History   Socioeconomic History   Marital status: Married    Spouse name: Not on file   Number of children: Not on file   Years of education: Not on file   Highest education level: Not on file  Occupational History   Not on file  Tobacco Use   Smoking status: Never   Smokeless tobacco: Never  Vaping Use   Vaping Use: Never used  Substance and Sexual Activity   Alcohol use: Not Currently   Drug use: No   Sexual activity: Yes    Birth  control/protection: Other-see comments    Comment: husband with vasectomy  Other Topics Concern   Not on file  Social History Narrative   Not on file   Social Determinants of Health   Financial Resource Strain: Not on file  Food Insecurity: Not on file  Transportation Needs: Not on file  Physical Activity: Not on file  Stress: Not on file  Social Connections: Not on file  Intimate Partner Violence: Not on file    Past Surgical History:  Procedure Laterality Date   CESAREAN SECTION     G 3 P 2 ( C section X2)   WISDOM TOOTH EXTRACTION      Family History  Problem Relation Age of Onset   Heart attack Father 59   Lupus Mother        dermatologic   Lupus Brother        dermatologic   Hypertension Maternal Grandfather    Lung cancer Maternal Grandfather        smoker   Deep vein thrombosis Maternal Grandfather    Pulmonary embolism Other        5 maternal great uncles   Hypothyroidism Other        PGGM   Diabetes Neg Hx    Stroke Neg Hx    Colon cancer Neg Hx    Esophageal cancer Neg Hx  Stomach cancer Neg Hx    Rectal cancer Neg Hx     Allergies  Allergen Reactions   Penicillins Swelling    Angioedema of lip & tongue   Contrave [Naltrexone-Bupropion Hcl Er] Itching    Current Outpatient Medications on File Prior to Visit  Medication Sig Dispense Refill   buPROPion (WELLBUTRIN XL) 150 MG 24 hr tablet Take 150 mg by mouth daily.     ergocalciferol (VITAMIN D2) 1.25 MG (50000 UT) capsule ergocalciferol (vitamin D2) 1,250 mcg (50,000 unit) capsule  TAKE 1 CAPSULE BY MOUTH ONCE A WEEK     ibuprofen (ADVIL) 600 MG tablet Take 1 tablet (600 mg total) by mouth every 8 (eight) hours as needed for moderate pain (with food). 30 tablet 0   phentermine (ADIPEX-P) 37.5 MG tablet Take 1-1.5 tablets by mouth daily.     No current facility-administered medications on file prior to visit.    BP 130/82   Pulse 98   Temp 97.6 F (36.4 C) (Temporal)   Ht 5\' 2"  (1.575 m)    Wt 254 lb (115.2 kg)   SpO2 98%   BMI 46.46 kg/m  Objective:   Physical Exam Cardiovascular:     Rate and Rhythm: Normal rate and regular rhythm.  Pulmonary:     Effort: Pulmonary effort is normal.     Breath sounds: Normal breath sounds.  Musculoskeletal:     Right shoulder: Normal.     Left shoulder: Normal range of motion. Decreased strength.     Cervical back: Normal range of motion.     Comments: Overall normal ROM to bilateral upper extremities but she does hesitate and slow down with raising her left upper extremity past shoulder height.   Grips equal bilaterally.           Assessment & Plan:      This visit occurred during the SARS-CoV-2 public health emergency.  Safety protocols were in place, including screening questions prior to the visit, additional usage of staff PPE, and extensive cleaning of exam room while observing appropriate contact time as indicated for disinfecting solutions.

## 2020-12-17 NOTE — Assessment & Plan Note (Signed)
Suspicious for rotator cuff involvement, especially given mild weakness noted.  Checking plain films of the shoulder today. Referral placed for PT. Consider ortho referral if warranted. Discussed use of Voltaren Gel and oral NSAID's.   Discussed to refrain from lifting weights at the gym for now.  She will update with PCP.

## 2020-12-30 ENCOUNTER — Ambulatory Visit (INDEPENDENT_AMBULATORY_CARE_PROVIDER_SITE_OTHER): Payer: Self-pay | Admitting: Physical Therapy

## 2020-12-30 ENCOUNTER — Other Ambulatory Visit: Payer: Self-pay

## 2020-12-30 ENCOUNTER — Encounter: Payer: Self-pay | Admitting: Physical Therapy

## 2020-12-30 DIAGNOSIS — M6281 Muscle weakness (generalized): Secondary | ICD-10-CM

## 2020-12-30 DIAGNOSIS — M25512 Pain in left shoulder: Secondary | ICD-10-CM

## 2020-12-30 NOTE — Therapy (Addendum)
[facility] [address] [place], [place], [address] Phone: [phone]   Fax:  [phone]  Physical Therapy Evaluation/Discharge PHYSICAL THERAPY DISCHARGE SUMMARY  Visits from Start of Care: 1  Current functional level related to goals / functional outcomes: See note   Remaining deficits: See note   Education / Equipment: HEP  Plan: Patient is being discharged due to not returning to PT.  Ivery Quale, PT, DPT 07/19/21 11:05 AM     Patient Details  Name: Helen Miles MRN: [id number] Date of Birth: [birth year] Referring Provider (PT): Doreene Nest, NP   Encounter Date: 12/30/2020   PT End of Session - 12/30/20 1722     Visit Number 1    Number of Visits 4    Date for PT Re-Evaluation 02/10/21    PT Start Time 1600    PT Stop Time 1640    PT Time Calculation (min) 40 min    Activity Tolerance Patient tolerated treatment well    Behavior During Therapy WFL for tasks assessed/performed             Past Medical History:  Diagnosis Date   Generalized headaches    Obesity     Past Surgical History:  Procedure Laterality Date   CESAREAN SECTION     G 3 P 2 ( C section X2)   WISDOM TOOTH EXTRACTION      There were no vitals filed for this visit.    Subjective Assessment - 12/30/20 1608     Subjective Lt shoulder pain for last 7 weeks with Lt arm weakness, may have been from covid or going up on her weight training regimin. She has now stopped working out at MD request and reports to PT. She is no longer having much pain but is concerned with the weakness paticularly with OH movments.    Patient Stated Goals workout again and get back to normal    Currently in Pain? Yes    Pain Score 5    no pain at rest today can get up to 5 with reaching OH   Pain Location Shoulder    Pain Orientation Left    Pain Descriptors / Indicators Aching    Pain Type Acute pain    Pain Radiating Towards denies N/T    Pain  Onset More than a month ago    Pain Frequency Intermittent    Aggravating Factors  reaching or lifting    Pain Relieving Factors rest    Multiple Pain Sites No                [facility] PT Assessment - 12/30/20 0001       Assessment   Medical Diagnosis Lt shoulder pain    Referring Provider (PT) Doreene Nest, NP    Onset Date/Surgical Date --   7 week onset of pain   Hand Dominance Right    Next MD Visit nothing scheduled    Prior Therapy none      Precautions   Precautions None      Restrictions   Weight Bearing Restrictions No      Balance Screen   Has the patient fallen in the past 6 months No    Has the patient had a decrease in activity level because of a fear of falling?  No    Is the patient reluctant to leave their home because of a fear of falling?  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      Cognition   Overall Cognitive Status Within Functional Limits for tasks assessed      Observation/Other Assessments   Focus on Therapeutic Outcomes (FOTO)  64% functional      ROM / Strength   AROM / PROM / Strength AROM;Strength      AROM   Overall AROM Comments WNL Lt shoulder ROM  but slight pain and apprehension OH      Strength   Overall Strength Comments elbow strength 5/5    Strength Assessment Site Shoulder    Right/Left Shoulder Left    Left Shoulder Flexion 4/5    Left Shoulder ABduction 4-/5    Left Shoulder Internal Rotation 5/5    Left Shoulder External Rotation 4/5      Palpation   Palpation comment good GH mobiliity, excessive ROM into ER      Special Tests   Other special tests negative impingment tests, negative crank test, negative belly press test, negative drop arm test                        Objective measurements completed on examination: See above findings.                PT Education - 12/30/20 1722     Education Details HEP review,  exam findings, POC, return to gym activity modifications    Person(s) Educated Patient    Methods Explanation;Demonstration;Verbal cues;Handout    Comprehension Returned demonstration;Verbalized understanding;Need further instruction                         Plan - 12/30/20 1723     Clinical Impression Statement Pt presents with acute onset of Lt shoulder pain and weakness. She does not describe this as radiculopathy and her symptoms have improved latey. She likely had either nerve root inflammation that is resolving or possible tendoinitis/bursits. She is not really having pain now unless she reaches overhead. At this point I feel she can resume her gym activity with modifications and I provided her with additional shoulder strengthening and stability exercises and she shows good understanding of this. She does not feel she needs to schedule PT at this time and will trial her HEP. She will call PT back in the event she feels she needs to return. I will write her plan for 4 visits in 6 weeks in case she does need to return to PT and at that time we will write PT goals for her. We will discharge after 6 weeks if she has not returned.    Examination-Activity Limitations Carry;Lift;Reach Overhead    Examination-Participation Restrictions Driving    Stability/Clinical Decision Making Stable/Uncomplicated    Clinical Decision Making Low    Rehab Potential Good    PT Frequency Other (comment)   as needed   PT Duration 6 weeks    PT Treatment/Interventions Electrical Stimulation;Iontophoresis 4mg /ml Dexamethasone;Moist Heat;Ultrasound;Therapeutic activities;Therapeutic exercise;Neuromuscular re-education;Vasopneumatic Device;Taping;Passive range of motion;Joint Manipulations    PT Next Visit Plan how is HEP, focus on strength    PT Home Exercise Plan see pt instructions    Consulted and Agree with Plan of Care Patient             Patient will benefit from skilled therapeutic  intervention in order to improve the following deficits and impairments:  Decreased strength, Impaired UE functional use  Visit Diagnosis: Acute pain of left shoulder  Muscle weakness (generalized)     Problem List Patient Active Problem List   Diagnosis Date Noted   Shoulder pain, left 12/17/2020   Flu-like symptoms 08/30/2020   Cough 08/30/2020   Special screening for malignant neoplasms, colon 04/08/2020   Family history of colonic polyps 08/04/2019   Headache(784.0) 06/03/2009   TACHYCARDIA, HX OF 06/03/2009   Obesity, morbid (HCC) 11/12/2008   HIATAL HERNIA 10/23/2007   HYPERLIPIDEMIA 12/06/2006    April Manson, PT,DPT 12/30/2020, 5:29 PM  [facility] [address] [place], [place], [address] Phone: [phone]   Fax:  [phone]  Name: Helen Miles MRN: [id number] Date of Birth: 07/22/1970

## 2020-12-30 NOTE — Patient Instructions (Signed)
Access Code: [email or web address] URL: [email or web address] Date: 12/30/2020 Prepared by: Ivery Quale  Exercises Low Trap Setting at [facility] - 1-2 x daily - 6 x weekly - 1-2 sets - 10 reps Standing Shoulder Flexion with Resistance - 1-2 x daily - 6 x weekly - 1-3 sets - 10 reps Standing Single Arm Shoulder Abduction with Resistance - 1-2 x daily - 6 x weekly - 1-3 sets - 10 reps Shoulder External Rotation with Anchored Resistance - 1-2 x daily - 6 x weekly - 2-3 sets - 10 reps Standing Shoulder Row with Anchored Resistance - 1-2 x daily - 6 x weekly - 2-3 sets - 15 reps

## 2021-01-03 ENCOUNTER — Encounter: Payer: Self-pay | Admitting: Nurse Practitioner

## 2021-01-03 DIAGNOSIS — E559 Vitamin D deficiency, unspecified: Secondary | ICD-10-CM | POA: Insufficient documentation

## 2021-02-02 LAB — HM DIABETES EYE EXAM

## 2021-03-15 ENCOUNTER — Ambulatory Visit (INDEPENDENT_AMBULATORY_CARE_PROVIDER_SITE_OTHER): Payer: No Typology Code available for payment source | Admitting: Nurse Practitioner

## 2021-03-15 ENCOUNTER — Other Ambulatory Visit: Payer: Self-pay

## 2021-03-15 ENCOUNTER — Encounter: Payer: Self-pay | Admitting: Nurse Practitioner

## 2021-03-15 VITALS — BP 130/82 | HR 68 | Temp 97.1°F | Ht 62.0 in | Wt 254.2 lb

## 2021-03-15 DIAGNOSIS — R0789 Other chest pain: Secondary | ICD-10-CM | POA: Diagnosis not present

## 2021-03-15 DIAGNOSIS — B029 Zoster without complications: Secondary | ICD-10-CM | POA: Diagnosis not present

## 2021-03-15 NOTE — Patient Instructions (Signed)
Continue use of zinc oxide cream or calamine lotion to help with itching.  Alternate between warm and cold compress as needed Use aleve 220mg  every 12hrs prn or ibuprofen 400mg  every 8hrs prn for pain. Stretch before and after exercise Avoid weight lifting till symptoms resolve.  Chest Wall Pain Chest wall pain is pain in or around the bones and muscles of your chest. Chest wall pain may be caused by: An injury. Coughing a lot. Using your chest and arm muscles too much. Sometimes, the cause may not be known. This pain may take a few weeks or longer to get better. Follow these instructions at home: Managing pain, stiffness, and swelling If told, put ice on the painful area: Put ice in a plastic bag. Place a towel between your skin and the bag. Leave the ice on for 20 minutes, 2-3 times a day.  Activity Rest as told by your doctor. Avoid doing things that cause pain. This includes lifting heavy items. Ask your doctor what activities are safe for you. General instructions  Take over-the-counter and prescription medicines only as told by your doctor. Do not use any products that contain nicotine or tobacco, such as cigarettes, e-cigarettes, and chewing tobacco. If you need help quitting, ask your doctor. Keep all follow-up visits as told by your doctor. This is important. Contact a doctor if: You have a fever. Your chest pain gets worse. You have new symptoms. Get help right away if: You feel sick to your stomach (nauseous) or you throw up (vomit). You feel sweaty or light-headed. You have a cough with mucus from your lungs (sputum) or you cough up blood. You are short of breath. These symptoms may be an emergency. Do not wait to see if the symptoms will go away. Get medical help right away. Call your local emergency services (911 in the [place] Do not drive yourself to the hospital. Summary Chest wall pain is pain in or around the bones and muscles of your chest. It may be  treated with ice, rest, and medicines. Your condition may also get better if you avoid doing things that cause pain. Contact a doctor if you have a fever, chest pain that gets worse, or new symptoms. Get help right away if you feel light-headed or you get short of breath. These symptoms may be an emergency. This information is not intended to replace advice given to you by your health care provider. Make sure you discuss any questions you have with your health care provider. Document Revised: 05/04/2020 Document Reviewed: 04/16/2020 Elsevier Patient Education  2022 06/16/2020.

## 2021-03-15 NOTE — Progress Notes (Signed)
Subjective:  Patient ID: Helen Miles, female    DOB: Aug 22, 1970  Age: 51 y.o. MRN: [id number]  CC: Acute Visit (Pt c/o rash on both sides of her stomach and some chest discomfort. Pt states the chest discomfort started yesterday and comes and goes and Helen Miles notices it more with certain movements./Pt also states her shoulder pain has not improved with PT. )   Rash This is a new problem. The current episode started 1 to 4 weeks ago. The problem has been resolved since onset. The affected locations include the abdomen. The rash is characterized by blistering, redness and itchiness. Helen Miles was exposed to nothing. Pertinent negatives include no cough, fatigue, fever, joint pain or shortness of breath. Past treatments include anti-itch cream. The treatment provided significant relief. Her past medical history is significant for varicella. There is no history of allergies, asthma or eczema.  Chest Pain  This is a recurrent problem. The current episode started 1 to 4 weeks ago. The onset quality is gradual. The problem occurs intermittently. The problem has been waxing and waning. The pain is present in the substernal region. The pain is mild. The quality of the pain is described as dull. The pain does not radiate. Pertinent negatives include no abdominal pain, back pain, cough, diaphoresis, dizziness, exertional chest pressure, fever, irregular heartbeat, malaise/fatigue, nausea, near-syncope, numbness, shortness of breath or weakness. The pain is aggravated by lifting arm and lifting. Helen Miles has tried rest for the symptoms. The treatment provided significant relief. Risk factors include obesity.  Pertinent negatives for past medical history include no CAD, no cancer, no diabetes, no DVT and no hypertension.  Her family medical history is significant for CAD, hypertension and PE.  Pertinent negatives for family medical history include: no aortic dissection, no heart disease, no early MI and no sudden death.    Wt Readings from Last 3 Encounters:  03/15/21 254 lb 3.2 oz (115.3 kg)  12/17/20 254 lb (115.2 kg)  11/08/20 252 lb 12.8 oz (114.7 kg)    Family History  Problem Relation Age of Onset   Heart attack Father 58   Lupus Mother        dermatologic   Lupus Brother        dermatologic   Hypertension Maternal Grandfather    Lung cancer Maternal Grandfather        smoker   Deep vein thrombosis Maternal Grandfather    Pulmonary embolism Other        5 maternal great uncles   Hypothyroidism Other        PGGM   Diabetes Neg Hx    Stroke Neg Hx    Colon cancer Neg Hx    Esophageal cancer Neg Hx    Stomach cancer Neg Hx    Rectal cancer Neg Hx     Reviewed past Medical, Social and Family history today.  Outpatient Medications Prior to Visit  Medication Sig Dispense Refill   buPROPion (WELLBUTRIN XL) 150 MG 24 hr tablet Take 150 mg by mouth daily.     ergocalciferol (VITAMIN D2) 1.25 MG (50000 UT) capsule ergocalciferol (vitamin D2) 1,250 mcg (50,000 unit) capsule  TAKE 1 CAPSULE BY MOUTH ONCE A WEEK     ibuprofen (ADVIL) 600 MG tablet Take 1 tablet (600 mg total) by mouth every 8 (eight) hours as needed for moderate pain (with food). 30 tablet 0   phentermine (ADIPEX-P) 37.5 MG tablet Take 1-1.5 tablets by mouth daily. (Patient not taking: Reported on 03/15/2021)  No facility-administered medications prior to visit.    ROS See HPI  Objective:  BP 130/82 (BP Location: Left Arm, Cuff Size: Large)    Pulse 68    Temp (!) 97.1 F (36.2 C) (Temporal)    Ht 5\' 2"  (1.575 m)    Wt 254 lb 3.2 oz (115.3 kg)    SpO2 98%    BMI 46.49 kg/m   Physical Exam Cardiovascular:     Rate and Rhythm: Normal rate and regular rhythm.     Pulses: Normal pulses.     Heart sounds: Normal heart sounds.  Pulmonary:     Effort: Pulmonary effort is normal.     Breath sounds: Normal breath sounds.  Chest:     Chest wall: No mass, tenderness or crepitus.  Musculoskeletal:     Right lower leg: No  edema.     Left lower leg: No edema.  Lymphadenopathy:     Upper Body:     Right upper body: No supraclavicular or pectoral adenopathy.     Left upper body: No supraclavicular or pectoral adenopathy.  Skin:    General: Skin is warm and dry.     Findings: Rash present. No erythema. Rash is papular.          Comments: Clustered papular lesion: healing with scabs  Neurological:     Mental Status: Helen Miles is alert and oriented to person, place, and time.   Assessment & Plan:  This visit occurred during the SARS-CoV-2 public health emergency.  Safety protocols were in place, including screening questions prior to the visit, additional usage of staff PPE, and extensive cleaning of exam room while observing appropriate contact time as indicated for disinfecting solutions.   Andrew was seen today for acute visit.  Diagnoses and all orders for this visit:  Herpes zoster without complication  Chest wall pain  Rash present x 4weeks, unable to prescribed valtrex. Continue use of zinc oxide cream or calamine lotion to help with itching. Alternate between warm and cold compress as needed Use aleve 220mg  every 12hrs prn or ibuprofen 400mg  every 8hrs prn for pain. Stretch before and after exercise Avoid weight lifting till symptoms resolve.  Problem List Items Addressed This Visit   None Visit Diagnoses     Herpes zoster without complication    -  Primary   Chest wall pain           Follow-up: Return if symptoms worsen or fail to improve.  Camelia Eng, NP

## 2021-04-11 ENCOUNTER — Other Ambulatory Visit: Payer: Self-pay | Admitting: Obstetrics

## 2021-04-11 ENCOUNTER — Ambulatory Visit
Admission: RE | Admit: 2021-04-11 | Discharge: 2021-04-11 | Disposition: A | Discharge status: Home / Self Care | Payer: No Typology Code available for payment source | Source: Ambulatory Visit | Attending: Obstetrics | Admitting: Obstetrics

## 2021-04-11 ENCOUNTER — Other Ambulatory Visit: Payer: Self-pay

## 2021-04-11 DIAGNOSIS — R928 Other abnormal and inconclusive findings on diagnostic imaging of breast: Secondary | ICD-10-CM

## 2021-04-11 DIAGNOSIS — N6310 Unspecified lump in the right breast, unspecified quadrant: Secondary | ICD-10-CM

## 2021-04-27 ENCOUNTER — Encounter: Payer: Self-pay | Admitting: Nurse Practitioner

## 2021-04-27 ENCOUNTER — Telehealth (INDEPENDENT_AMBULATORY_CARE_PROVIDER_SITE_OTHER): Payer: No Typology Code available for payment source | Admitting: Nurse Practitioner

## 2021-04-27 DIAGNOSIS — J0100 Acute maxillary sinusitis, unspecified: Secondary | ICD-10-CM

## 2021-04-27 MED ORDER — AZITHROMYCIN 250 MG PO TABS: ORAL_TABLET | ORAL | 0 refills | Status: AC

## 2021-04-27 NOTE — Progress Notes (Signed)
?[facility] ?[facility] ?[address] ?[place] [address] ?Dept: [phone] ?Dept Fax: [phone] ? ?Virtual Video Visit ? ?I connected with [name] on 04/27/21 at  4:00 PM EDT by a video enabled telemedicine application and verified that I am speaking with the correct person using two identifiers. ? ?Location patient: Home ?Location provider: Clinic ?Persons participating in the virtual visit: Patient, Provider, CMA ? ?I discussed the limitations of evaluation and management by telemedicine and the availability of in person appointments. The patient expressed understanding and agreed to proceed. ? ?Chief Complaint  ?Patient presents with  ? URI  ?  Pt c/o cough, nasal drainage, headache, w/ sinus pressure x5 days. At-home covid test neg 03/11  ? ? ?SUBJECTIVE: ? ?HPI: Helen Miles is a 51 y.o. female who presents with cough, sinus pressure, congestion, and headache for 5 days. Covid-19 test negative. She normally takes the sinus medication and her symptoms go away, but they are not clearing up this time.  ? ?UPPER RESPIRATORY TRACT INFECTION ? 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 ? ?Patient Active Problem List  ? Diagnosis Date Noted  ? Vitamin D deficiency 01/03/2021  ? Shoulder pain, left 12/17/2020  ?  Flu-like symptoms 08/30/2020  ? Cough 08/30/2020  ? Special screening for malignant neoplasms, colon 04/08/2020  ? Family history of colonic polyps 08/04/2019  ? Headache(784.0) 06/03/2009  ? TACHYCARDIA, HX OF 06/03/2009  ? Obesity, morbid ([facility]) 11/12/2008  ? HIATAL HERNIA 10/23/2007  ? HYPERLIPIDEMIA 12/06/2006  ? ? ?Past Surgical History:  ?Procedure Laterality Date  ? CESAREAN SECTION    ? G 3 P 2 ( C section X2)  ? WISDOM TOOTH EXTRACTION    ? ? ?Family History  ?Problem Relation Age of Onset  ? Heart attack Father 87  ? Lupus Mother   ?     dermatologic  ? Lupus Brother   ?     dermatologic  ? Hypertension Maternal Grandfather   ? Lung cancer Maternal Grandfather   ?     smoker  ? Deep vein thrombosis Maternal Grandfather   ? Pulmonary embolism Other   ?     5 maternal great uncles  ? Hypothyroidism Other   ?     PGGM  ? Diabetes Neg Hx   ? Stroke Neg Hx   ? Colon cancer Neg Hx   ? Esophageal cancer Neg Hx   ? Stomach cancer Neg Hx   ? Rectal cancer Neg Hx   ? ? ?Social History  ? ?Tobacco Use  ? Smoking status: Never  ? Smokeless tobacco: Never  ?Vaping Use  ? Vaping Use: Never used  ?Substance Use Topics  ? Alcohol use: Not Currently  ? Drug use: No  ? ? ? ?Current Outpatient Medications:  ?  azithromycin (ZITHROMAX) 250 MG tablet, Take 2 tablets on day 1, then 1 tablet daily  on days 2 through 5, Disp: 6 tablet, Rfl: 0 ?  buPROPion (WELLBUTRIN XL) 150 MG 24 hr tablet, Take 150 mg by mouth daily., Disp: , Rfl:  ?  ergocalciferol (VITAMIN D2) 1.25 MG (50000 UT) capsule, ergocalciferol (vitamin D2) 1,250 mcg (50,000 unit) capsule  TAKE 1 CAPSULE BY MOUTH ONCE A WEEK, Disp: , Rfl:  ?  ibuprofen (ADVIL) 600 MG tablet, Take 1 tablet (600 mg total) by mouth every 8 (eight) hours as needed for moderate pain (with food)., Disp: 30 tablet, Rfl: 0 ? ?Allergies  ?Allergen Reactions  ? Penicillins Swelling  ?  Angioedema of lip & tongue  ? Contrave [Naltrexone-Bupropion Hcl Er] Itching  ? ? ?ROS: See pertinent positives  and negatives per HPI. ? ?OBSERVATIONS/OBJECTIVE: ? ?VITALS per patient if applicable: ?There were no vitals filed for this visit. ?There is no height or weight on file to calculate BMI. ?  ? ?GENERAL: Alert and oriented. Appears well and in no acute distress. ? ?HEENT: Atraumatic. Conjunctiva clear. No obvious abnormalities on inspection of external nose and ears. ? ?NECK: Normal movements of the head and neck. ? ?LUNGS: On inspection, no signs of respiratory distress. Breathing rate appears normal. No obvious gross SOB, gasping or wheezing, and no conversational dyspnea. ? ?CV: No obvious cyanosis. ? ?MS: Moves all visible extremities without noticeable abnormality. ? ?PSYCH/NEURO: Pleasant and cooperative. No obvious depression or anxiety. Speech and thought processing grossly intact. ? ?ASSESSMENT AND PLAN: ? ?Problem List Items Addressed This Visit   ?None ?Visit Diagnoses   ? ? Acute non-recurrent maxillary sinusitis    -  Primary  ? Start Z-Pak 2 tablets today, then 1 tablet daily.  Continue NyQuil and Robitussin.  Follow-up if symptoms are not improving.  ? Relevant Medications  ? azithromycin (ZITHROMAX) 250 MG tablet  ? ?  ? ?  ?I discussed the assessment and treatment plan with the patient. The patient was provided an opportunity to ask questions and all were answered. The patient agreed with the plan and demonstrated an understanding of the instructions. ?  ?The patient was advised to call back or seek an in-person evaluation if the symptoms worsen or if the condition fails to improve as anticipated. ? ?Time spent on call:  7 minutes with patient face to face via video conference. More than 50% of this time was spent in counseling and coordination of care. 5 minutes total spent in review of patient's record and preparation of their chart. ? ? ?Gerre Scull, NP  ?

## 2021-04-27 NOTE — Patient Instructions (Signed)
It was great to see you! ? ?Start z-pak 2 tablets today, then 1 tablet daily until gone. Continue your NyQuil and Robitussin as needed.  Make sure you are drinking plenty of fluids and getting rest. ? ?Let's follow-up if your symptoms do not improve or worsen ? ?Take care, ? ?Rodman Pickle, NP ? ?

## 2021-05-26 ENCOUNTER — Ambulatory Visit (INDEPENDENT_AMBULATORY_CARE_PROVIDER_SITE_OTHER): Payer: No Typology Code available for payment source | Admitting: Family Medicine

## 2021-05-26 VITALS — BP 124/80 | HR 60 | Temp 97.9°F | Ht 62.0 in | Wt 268.4 lb

## 2021-05-26 DIAGNOSIS — M79605 Pain in left leg: Secondary | ICD-10-CM

## 2021-05-26 MED ORDER — NAPROXEN 500 MG PO TABS: 500.0000 mg | ORAL_TABLET | Freq: Two times a day (BID) | ORAL | 0 refills | Status: DC

## 2021-05-26 NOTE — Progress Notes (Signed)
?[facility] ?[facility] ?[place] ?[place] [address] ?Dept: [phone] ?Dept Fax: [phone] ? ?Office Visit ? ?Subjective:  ? ? Patient ID: Helen Miles, female    DOB: [birth year], 51 y.o..   MRN: [id number] ? ?Chief Complaint  ?Patient presents with  ? Acute Visit  ?  C/o having upper LT leg pain x 3 days.  Leg feels heavy and limping by the end of the day.   No OTC meds.    ? ? ?History of Present Illness: ? ?Patient is in today complaining of a 3-day history of left upper leg pain. She notes that this started hurting on Monday evening. She had started her menses over the weekend, so thought this might have been involved, but no longer feels so. She did go for longer car rides on Sunday and Monday. She did take stops on Monday and got out to walk around some. She denies any other trauma to the leg. She walks regularly on a treadmill and has not had a change to her exercise routine recently. She denies any fever. She has not noted any particular leg swelling. She has not tried any particular approaches to help the leg feel better. The pain is better in the mornings and gets worse as the day goes on. ? ?Past Medical History: ?Patient Active Problem List  ? Diagnosis Date Noted  ? Vitamin D deficiency 01/03/2021  ? Shoulder pain, left 12/17/2020  ? Flu-like symptoms 08/30/2020  ? Cough 08/30/2020  ? Special screening for malignant neoplasms, colon 04/08/2020  ? Family history of colonic polyps 08/04/2019  ? Headache(784.0) 06/03/2009  ? TACHYCARDIA, HX OF 06/03/2009  ? Obesity, morbid ([facility]) 11/12/2008  ? HIATAL HERNIA 10/23/2007  ? HYPERLIPIDEMIA 12/06/2006  ? ?Past Surgical History:  ?Procedure Laterality Date  ? CESAREAN SECTION    ? G 3 P 2 ( C section X2)  ? WISDOM TOOTH EXTRACTION    ? ?Family History  ?Problem Relation Age of Onset  ? Heart attack Father 13  ? Lupus Mother   ?     dermatologic  ? Lupus Brother   ?     dermatologic  ? Hypertension  Maternal Grandfather   ? Lung cancer Maternal Grandfather   ?     smoker  ? Deep vein thrombosis Maternal Grandfather   ? Pulmonary embolism Other   ?     5 maternal great uncles  ? Hypothyroidism Other   ?     PGGM  ? Diabetes Neg Hx   ? Stroke Neg Hx   ? Colon cancer Neg Hx   ? Esophageal cancer Neg Hx   ? Stomach cancer Neg Hx   ? Rectal cancer Neg Hx   ? ?Outpatient Medications Prior to Visit  ?Medication Sig Dispense Refill  ? buPROPion (WELLBUTRIN XL) 150 MG 24 hr tablet Take 150 mg by mouth daily.    ? ergocalciferol (VITAMIN D2) 1.25 MG (50000 UT) capsule ergocalciferol (vitamin D2) 1,250 mcg (50,000 unit) capsule ? TAKE 1 CAPSULE BY MOUTH ONCE A WEEK    ? ibuprofen (ADVIL) 600 MG tablet Take 1 tablet (600 mg total) by mouth every 8 (eight) hours as needed for moderate pain (with food). 30 tablet 0  ? ?No facility-administered medications prior to visit.  ? ?Allergies  ?Allergen Reactions  ? Penicillins Swelling  ?  Angioedema of lip & tongue  ? Contrave [Naltrexone-Bupropion Hcl Er] Itching  ?   ?Objective:  ? ?Today's  Vitals  ? 05/26/21 1133  ?BP: 124/80  ?Pulse: 60  ?Temp: 97.9 ?F (36.6 ?C)  ?TempSrc: Temporal  ?SpO2: 97%  ?Weight: 268 lb 6.4 oz (121.7 kg)  ?Height: 5\' 2"  (1.575 m)  ? ?Body mass index is 49.09 kg/m?.  ? ?General: Well developed, well nourished. No acute distress. ?Extremities: Full ROM. No joint swelling. Pain is indicated over the anterior upper left leg, below the  ? inguinal area. No increased pain with passive or resisted movements.Trace edema of lower legs,  ? bilat. ?Psych: Alert and oriented. Normal mood and affect. ? ?There are no preventive care reminders to display for this patient. ?   ?Assessment & Plan:  ? ?1. Leg pain, anterior, left ?The pain appears to possibly be muscular. I recommend she take an antiinflammatory for 5 days. She should apply heat to this area in the evenings. If not improved next week, she should return ot see Ms. Nche for a reassessment. ? ?- naproxen  (NAPROSYN) 500 MG tablet; Take 1 tablet (500 mg total) by mouth 2 (two) times daily with a meal.  Dispense: 30 tablet; Refill: 0 ? ? ?Return in about 1 week (around 06/02/2021), or if symptoms worsen or fail to improve.  ? ?Haydee Salter, MD ?

## 2021-05-26 NOTE — Patient Instructions (Signed)
Apply heat to upper leg every evening. ?

## 2021-07-01 ENCOUNTER — Telehealth: Payer: Self-pay | Admitting: Nurse Practitioner

## 2021-07-01 NOTE — Telephone Encounter (Signed)
Spoke w/ pt, told her she needed an appt to get a rx for muscle relaxer's. States that she would like to use her ibuprofen and naproxen first to see how it helps her back, if it doesn't improve by Monday, she says that she'll callback to make an appt.

## 2021-07-01 NOTE — Telephone Encounter (Signed)
Pt said she has back pain and would like a couple of pills...Marland KitchenMarland Kitchen

## 2021-08-26 ENCOUNTER — Other Ambulatory Visit (HOSPITAL_COMMUNITY): Payer: Self-pay

## 2021-08-26 MED ORDER — WEGOVY 0.5 MG/0.5ML ~~LOC~~ SOAJ
0.5000 mg | SUBCUTANEOUS | 0 refills | Status: DC
Start: 2021-08-26 — End: 2021-09-26
  Filled 2021-08-26: qty 2, 28d supply, fill #0

## 2021-09-23 ENCOUNTER — Ambulatory Visit (INDEPENDENT_AMBULATORY_CARE_PROVIDER_SITE_OTHER): Payer: No Typology Code available for payment source | Admitting: Nurse Practitioner

## 2021-09-23 ENCOUNTER — Ambulatory Visit: Payer: No Typology Code available for payment source | Admitting: Nurse Practitioner

## 2021-09-23 ENCOUNTER — Encounter: Payer: Self-pay | Admitting: Nurse Practitioner

## 2021-09-23 VITALS — BP 122/86 | HR 66 | Temp 97.1°F | Ht 62.0 in | Wt 248.0 lb

## 2021-09-23 DIAGNOSIS — Z23 Encounter for immunization: Secondary | ICD-10-CM

## 2021-09-23 DIAGNOSIS — M546 Pain in thoracic spine: Secondary | ICD-10-CM | POA: Diagnosis not present

## 2021-09-23 DIAGNOSIS — M79605 Pain in left leg: Secondary | ICD-10-CM | POA: Diagnosis not present

## 2021-09-23 DIAGNOSIS — M25511 Pain in right shoulder: Secondary | ICD-10-CM

## 2021-09-23 DIAGNOSIS — G8929 Other chronic pain: Secondary | ICD-10-CM

## 2021-09-23 NOTE — Patient Instructions (Signed)
Start daily back exercise. Avoid weight lifting Will need referral to ortho spine if no improvement with home exercise. Ok to use naproxen or ibuprofen or tylenol as needed for pain.  Back Exercises The following exercises strengthen the muscles that help to support the trunk (torso) and back. They also help to keep the lower back flexible. Doing these exercises can help to prevent or lessen existing low back pain. If you have back pain or discomfort, try doing these exercises 2-3 times each day or as told by your health care provider. As your pain improves, do them once each day, but increase the number of times that you repeat the steps for each exercise (do more repetitions). To prevent the recurrence of back pain, continue to do these exercises once each day or as told by your health care provider. Do exercises exactly as told by your health care provider and adjust them as directed. It is normal to feel mild stretching, pulling, tightness, or discomfort as you do these exercises, but you should stop right away if you feel sudden pain or your pain gets worse. Exercises Single knee to chest Repeat these steps 3-5 times for each leg: Lie on your back on a firm bed or the floor with your legs extended. Bring one knee to your chest. Your other leg should stay extended and in contact with the floor. Hold your knee in place by grabbing your knee or thigh with both hands and hold. Pull on your knee until you feel a gentle stretch in your lower back or buttocks. Hold the stretch for 10-30 seconds. Slowly release and straighten your leg.  Pelvic tilt Repeat these steps 5-10 times: Lie on your back on a firm bed or the floor with your legs extended. Bend your knees so they are pointing toward the ceiling and your feet are flat on the floor. Tighten your lower abdominal muscles to press your lower back against the floor. This motion will tilt your pelvis so your tailbone points up toward the  ceiling instead of pointing to your feet or the floor. With gentle tension and even breathing, hold this position for 5-10 seconds.  Cat-cow Repeat these steps until your lower back becomes more flexible: Get into a hands-and-knees position on a firm bed or the floor. Keep your hands under your shoulders, and keep your knees under your hips. You may place padding under your knees for comfort. Let your head hang down toward your chest. Contract your abdominal muscles and point your tailbone toward the floor so your lower back becomes rounded like the back of a cat. Hold this position for 5 seconds. Slowly lift your head, let your abdominal muscles relax, and point your tailbone up toward the ceiling so your back forms a sagging arch like the back of a cow. Hold this position for 5 seconds.  Press-ups Repeat these steps 5-10 times: Lie on your abdomen (face-down) on a firm bed or the floor. Place your palms near your head, about shoulder-width apart. Keeping your back as relaxed as possible and keeping your hips on the floor, slowly straighten your arms to raise the top half of your body and lift your shoulders. Do not use your back muscles to raise your upper torso. You may adjust the placement of your hands to make yourself more comfortable. Hold this position for 5 seconds while you keep your back relaxed. Slowly return to lying flat on the floor.  Bridges Repeat these steps 10 times: Lie on  your back on a firm bed or the floor. Bend your knees so they are pointing toward the ceiling and your feet are flat on the floor. Your arms should be flat at your sides, next to your body. Tighten your buttocks muscles and lift your buttocks off the floor until your waist is at almost the same height as your knees. You should feel the muscles working in your buttocks and the back of your thighs. If you do not feel these muscles, slide your feet 1-2 inches (2.5-5 cm) farther away from your buttocks. Hold  this position for 3-5 seconds. Slowly lower your hips to the starting position, and allow your buttocks muscles to relax completely. If this exercise is too easy, try doing it with your arms crossed over your chest. Abdominal crunches Repeat these steps 5-10 times: Lie on your back on a firm bed or the floor with your legs extended. Bend your knees so they are pointing toward the ceiling and your feet are flat on the floor. Cross your arms over your chest. Tip your chin slightly toward your chest without bending your neck. Tighten your abdominal muscles and slowly raise your torso high enough to lift your shoulder blades a tiny bit off the floor. Avoid raising your torso higher than that because it can put too much stress on your lower back and does not help to strengthen your abdominal muscles. Slowly return to your starting position.  Back lifts Repeat these steps 5-10 times: Lie on your abdomen (face-down) with your arms at your sides, and rest your forehead on the floor. Tighten the muscles in your legs and your buttocks. Slowly lift your chest off the floor while you keep your hips pressed to the floor. Keep the back of your head in line with the curve in your back. Your eyes should be looking at the floor. Hold this position for 3-5 seconds. Slowly return to your starting position.  Contact a health care provider if: Your back pain or discomfort gets much worse when you do an exercise. Your worsening back pain or discomfort does not lessen within 2 hours after you exercise. If you have any of these problems, stop doing these exercises right away. Do not do them again unless your health care provider says that you can. Get help right away if: You develop sudden, severe back pain. If this happens, stop doing the exercises right away. Do not do them again unless your health care provider says that you can. This information is not intended to replace advice given to you by your health  care provider. Make sure you discuss any questions you have with your health care provider. Document Revised: 07/27/2020 Document Reviewed: 04/14/2020 Elsevier Patient Education  2023 [employer]  Thoracic Strain Rehab Ask your health care provider which exercises are safe for you. Do exercises exactly as told by your health care provider and adjust them as directed. It is normal to feel mild stretching, pulling, tightness, or discomfort as you do these exercises. Stop right away if you feel sudden pain or your pain gets worse. Do not begin these exercises until told by your health care provider. Stretching and range-of-motion exercise This exercise warms up your muscles and joints and improves the movement and flexibility of your back and shoulders. This exercise also helps to relieve pain. Chest and spine stretch  Lie down on your back on a firm surface. Roll a towel or a small blanket so it is about 4 inches (10  cm) in diameter. Put the towel lengthwise under the middle of your back so it is under your spine, but not under your shoulder blades. Put your hands behind your head and let your elbows fall to your sides. This will increase your stretch. Take a deep breath (inhale). Hold for __________ seconds. Relax after you breathe out (exhale). Repeat __________ times. Complete this exercise __________ times a day. Strengthening exercises These exercises build strength and endurance in your back and your shoulder blade muscles. Endurance is the ability to use your muscles for a long time, even after they get tired. Alternating arm and leg raises  Get on your hands and knees on a firm surface. If you are on a hard floor, you may want to use padding, such as an exercise mat, to cushion your knees. Line up your arms and legs. Your hands should be directly below your shoulders, and your knees should be directly below your hips. Lift your left leg behind you. At the same time, raise your right  arm and straighten it in front of you. Do not lift your leg higher than your hip. Do not lift your arm higher than your shoulder. Keep your abdominal and back muscles tight. Keep your hips facing the ground. Do not arch your back. Keep your balance carefully, and do not hold your breath. Hold for __________ seconds. Slowly return to the starting position and repeat with your right leg and your left arm. Repeat __________ times. Complete this exercise __________ times a day. Straight arm rows This exercise is also called shoulder extension exercise. Stand with your feet shoulder width apart. Secure an exercise band to a stable object in front of you so the band is at or above shoulder height. Hold one end of the exercise band in each hand. Straighten your elbows and lift your hands up to shoulder height. Step back, away from the secured end of the exercise band, until the band stretches. Squeeze your shoulder blades together and pull your hands down to the sides of your thighs. Stop when your hands are straight down by your sides. This is shoulder extension. Do not let your hands go behind your body. Hold for __________ seconds. Slowly return to the starting position. Repeat __________ times. Complete this exercise __________ times a day. Prone shoulder external rotation Lie on your abdomen on a firm bed so your left / right forearm hangs over the edge of the bed and your upper arm is on the bed, straight out from your body. This is the prone position. Your elbow should be bent. Your palm should be facing your feet. If instructed, hold a __________ weight in your hand. Squeeze your shoulder blade toward the middle of your back. Do not let your shoulder lift toward your ear. Keep your elbow bent in a 90-degree angle (right angle) while you slowly move your forearm up toward the ceiling. Move your forearm up to the height of the bed, toward your head. This is external rotation. Your upper  arm should not move. At the top of the movement, your palm should face the floor. Hold for __________ seconds. Slowly return to the starting position and relax your muscles. Repeat __________ times. Complete this exercise __________ times a day. Rowing scapular retraction This is an exercise in which the shoulder blades (scapulae) are pulled toward each other (retraction). Sit in a stable chair without armrests, or stand up. Secure an exercise band to a stable object in front of you so  the band is at shoulder height. Hold one end of the exercise band in each hand. Your palms should face down. Bring your arms out straight in front of you. Step back, away from the secured end of the exercise band, until the band stretches. Pull the band backward. As you do this, bend your elbows and squeeze your shoulder blades together, but avoid letting the rest of your body move. Do not shrug your shoulders upward while you do this. Stop when your elbows are at your sides or slightly behind your body. Hold for __________ seconds. Slowly straighten your arms to return to the starting position. Repeat __________ times. Complete this exercise __________ times a day. Posture and body mechanics Good posture and healthy body mechanics can help to relieve stress in your body's tissues and joints. Body mechanics refers to the movements and positions of your body while you do your daily activities. Posture is part of body mechanics. Good posture means: Your spine is in its natural S-curve position (neutral). Your shoulders are pulled back slightly. Your head is not tipped forward. Follow these guidelines to improve your posture and body mechanics in your everyday activities. Standing  When standing, keep your spine neutral and your feet about hip width apart. Keep a slight bend in your knees. Your ears, shoulders, and hips should line up with each other. When you do a task in which you lean forward while standing  in one place for a long time, place one foot up on a stable object that is 2-4 inches (5-10 cm) high, such as a footstool. This helps keep your spine neutral. Sitting  When sitting, keep your spine neutral and keep your feet flat on the floor. Use a footrest, if necessary, and keep your thighs parallel to the floor. Avoid rounding your shoulders, and avoid tilting your head forward. When working at a desk or a computer, keep your desk at a height where your hands are slightly lower than your elbows. Slide your chair under your desk so you are close enough to maintain good posture. When working at a computer, place your monitor at a height where you are looking straight ahead and you do not have to tilt your head forward or downward to look at the screen. Resting When lying down and resting, avoid positions that are most painful for you. If you have pain with activities such as sitting, bending, stooping, or squatting (flexion-basedactivities), lie in a position in which your body does not bend very much. For example, avoid curling up on your side with your arms and knees near your chest (fetal position). If you have pain with activities such as standing for a long time or reaching with your arms (extension-basedactivities), lie with your spine in a neutral position and bend your knees slightly. Try the following positions: Lie on your side with a pillow between your knees. Lie on your back with a pillow under your knees.  Lifting  When lifting objects, keep your feet at least shoulder width apart and tighten your abdominal muscles. Bend your knees and hips and keep your spine neutral. It is important to lift using the strength of your legs, not your back. Do not lock your knees straight out. Always ask for help to lift heavy or awkward objects. This information is not intended to replace advice given to you by your health care provider. Make sure you discuss any questions you have with your health  care provider. Document Revised: 05/24/2018 Document Reviewed: 03/11/2018  Elsevier Patient Education  [place].

## 2021-09-23 NOTE — Progress Notes (Signed)
Established Patient Visit  Patient: Helen Miles   DOB: 1970-10-21   51 y.o. Female  MRN: [id number] Visit Date: 09/23/2021  Subjective:    Chief Complaint  Patient presents with   Acute Visit    No improvements in back /shoulder & leg pain  Possible MRI No other concerns  Shingles vaccine given today    HPI Helen Miles presents with right shoulder, upper back and left upper leg pain. Onset in last 2-59months. Pain is intermittent and does not occur at same time.  Shoulder pain occurs when arm is lifted to about 90degrees. Describes pain as sharp and last for a few seconds. No weakness, no swelling, no known shoulder of neck injury. Anterior leg pain has not changed since OV with Dr. Veto Kemps. Describes at heavy sensation. No radiation, no lower back pain. No improvement with naproxen. She also reports new onset of Upper back pain. Describes as heaviness, intermittent, no specific trigger, no chest pain, no rash, no weakness, no diaphoresis, no radiation Her current exercise routine has not changed: Strength training 3x/week, 3sets with 20reps each: use weight machine with added weights-chest, upper body and lower body.  Reviewed medical, surgical, and social history today  Medications: Outpatient Medications Prior to Visit  Medication Sig   buPROPion (WELLBUTRIN XL) 150 MG 24 hr tablet Take 150 mg by mouth daily.   ergocalciferol (VITAMIN D2) 1.25 MG (50000 UT) capsule ergocalciferol (vitamin D2) 1,250 mcg (50,000 unit) capsule  TAKE 1 CAPSULE BY MOUTH ONCE A WEEK   phentermine (ADIPEX-P) 37.5 MG tablet Take 37.5 mg by mouth daily.   Semaglutide-Weight Management (WEGOVY) 0.5 MG/0.5ML SOAJ Inject 0.5 mg into the skin once a week.   [DISCONTINUED] ibuprofen (ADVIL) 600 MG tablet Take 1 tablet (600 mg total) by mouth every 8 (eight) hours as needed for moderate pain (with food). (Patient not taking: Reported on 09/23/2021)   [DISCONTINUED] naproxen (NAPROSYN) 500 MG  tablet Take 1 tablet (500 mg total) by mouth 2 (two) times daily with a meal. (Patient not taking: Reported on 09/23/2021)   No facility-administered medications prior to visit.   Reviewed past medical and social history.   ROS per HPI above      Objective:  BP 122/86 (BP Location: Right Arm, Patient Position: Sitting, Cuff Size: Normal)   Pulse 66   Temp (!) 97.1 F (36.2 C) (Temporal)   Ht 5\' 2"  (1.575 m)   Wt 248 lb (112.5 kg)   SpO2 92%   BMI 45.36 kg/m      Physical Exam Constitutional:      General: She is not in acute distress. Cardiovascular:     Rate and Rhythm: Normal rate.     Pulses: Normal pulses.  Pulmonary:     Effort: Pulmonary effort is normal.  Chest:     Chest wall: No tenderness.  Musculoskeletal:     Right shoulder: Normal.     Left shoulder: Normal.     Right upper arm: Normal.     Left upper arm: Normal.     Cervical back: Normal.     Thoracic back: Normal.     Lumbar back: Normal.     Right hip: Normal.     Left hip: Normal.     Right upper leg: Normal.     Left upper leg: Normal.     Right lower leg: No edema.     Left lower  leg: No edema.  Skin:    General: Skin is warm and dry.     Findings: No erythema or rash.  Neurological:     Mental Status: She is alert and oriented to person, place, and time.     No results found for any visits on 09/23/21.    Assessment & Plan:    Problem List Items Addressed This Visit   None Visit Diagnoses     Chronic midline thoracic back pain    -  Primary   Need for shingles vaccine       Relevant Orders   Zoster Recombinant (Shingrix ) (Completed)   Acute pain of right shoulder       Leg pain, anterior, left         Musculoskeletal pain possibly related to exercise routine. Advised to avoid use of weights for exercise, start daily stretching exercise. Continue use of tylenol or ibuprofen as needed for pain. Provided home exercise. Consider outpatient PT, DG of lumbar and thoracic if no  improvement in 4-6weeks.  Return if symptoms worsen or fail to improve.     Alysia Penna, NP

## 2021-09-26 ENCOUNTER — Other Ambulatory Visit (HOSPITAL_COMMUNITY): Payer: Self-pay

## 2021-09-26 MED ORDER — WEGOVY 1 MG/0.5ML ~~LOC~~ SOAJ
1.0000 mg | SUBCUTANEOUS | 0 refills | Status: DC
  Filled 2021-09-26: qty 2, 28d supply, fill #0

## 2021-10-07 LAB — RESULTS CONSOLE HPV: CHL HPV: NEGATIVE

## 2021-10-07 LAB — HM PAP SMEAR

## 2021-10-07 NOTE — Telephone Encounter (Signed)
Na

## 2021-10-10 ENCOUNTER — Ambulatory Visit
Admission: RE | Admit: 2021-10-10 | Discharge: 2021-10-10 | Disposition: A | Discharge status: Home / Self Care | Payer: No Typology Code available for payment source | Source: Ambulatory Visit | Attending: Obstetrics | Admitting: Obstetrics

## 2021-10-10 DIAGNOSIS — N6310 Unspecified lump in the right breast, unspecified quadrant: Secondary | ICD-10-CM

## 2021-10-10 LAB — HM MAMMOGRAPHY

## 2021-10-26 ENCOUNTER — Other Ambulatory Visit (HOSPITAL_COMMUNITY): Payer: Self-pay

## 2021-10-26 MED ORDER — WEGOVY 1.7 MG/0.75ML ~~LOC~~ SOAJ
1.7000 mg | SUBCUTANEOUS | 0 refills | Status: DC
  Filled 2021-10-26: qty 3, 28d supply, fill #0

## 2021-11-18 ENCOUNTER — Ambulatory Visit (INDEPENDENT_AMBULATORY_CARE_PROVIDER_SITE_OTHER): Payer: No Typology Code available for payment source | Admitting: Nurse Practitioner

## 2021-11-18 ENCOUNTER — Encounter: Payer: Self-pay | Admitting: Nurse Practitioner

## 2021-11-18 VITALS — BP 126/84 | HR 86 | Temp 96.8°F | Ht 62.0 in | Wt 240.6 lb

## 2021-11-18 DIAGNOSIS — E782 Mixed hyperlipidemia: Secondary | ICD-10-CM | POA: Diagnosis not present

## 2021-11-18 DIAGNOSIS — Z0000 Encounter for general adult medical examination without abnormal findings: Secondary | ICD-10-CM | POA: Diagnosis not present

## 2021-11-18 NOTE — Patient Instructions (Addendum)
Have lab results faxed to me.  Protein Content in Foods Protein is a necessary nutrient in any diet. It helps build and repair muscles, bones, and skin. Depending on your overall health, you may need more or less protein in your diet. You are encouraged to eat a variety of protein foods to ensure that you get all the essential nutrients that are found in different protein foods. Talk with your health care provider or dietitian about how much protein you need each day and which sources of protein are best for you. Protein is especially important for: Repairing and making cells and tissues. Fighting infection. Providing energy. Growth and development. See the following list for the protein content of some common foods. What are tips for getting more protein in your diet? Try to replace processed carbohydrates with high-quality protein. Snack on nuts and seeds instead of chips. Replace baked desserts with [place] yogurt. Eat protein foods from both plant and animal sources. Replace red meat with seafood. Add beans and peas to salads, soups, and side dishes. Include a protein food with each meal and snack. Reading food labels You can find the amount of protein in a food item by looking at the nutrition facts label. Use the total grams listed to help you reach your daily goal. What foods are high in protein?  High-protein foods contain 4 grams (g) or more of protein per serving. They include: Grains Quinoa (cooked) -- 1 cup (185 g) has 8 g of protein. Whole wheat pasta (cooked) -- 1 cup (140 g) has 6 g of protein. Meat Beef, ground sirloin (cooked) -- 3 oz (85 g) has 24 g of protein. Chicken breast, boneless and skinless (cooked) -- 3 oz (85 g) has 25 g of protein. Egg -- 1 egg has 6 g of protein. Fish, filet (cooked) -- 1 oz (28 g) has 6-7 g of protein. Lamb (cooked) -- 3 oz (85 g) has 24 g of protein. Pork tenderloin (cooked) -- 3 oz (85 g) has 23 g of protein. Tuna (canned in water) -- 3  oz (85 g) has 20 g of protein. Dairy Cottage cheese --  cup (114 g) has 13.4 g of protein. Milk -- 1 cup (237 mL) has 8 g of protein. Cheese (hard) -- 1 oz (28 g) has 7 g of protein. Yogurt, regular -- 6 oz (170 g) has 8 g of protein. Greek yogurt -- 6 oz (200 g) has 18 g protein. Plant protein Garbanzo beans (canned or cooked) --  cup (130 g) has 6-7 g of protein. Kidney beans (canned or cooked) --  cup (130 g) has 6-7 g of protein. Nuts (peanuts, pistachios, almonds) -- 1 oz (28 g) has 6 g of protein. Peanut butter -- 1 oz (32 g) has 7-8 g of protein. Pumpkin seeds -- 1 oz (28 g) has 8.5 g of protein. Soybeans (roasted) -- 1 oz (28 g) has 8 g of protein. Soybeans (cooked) --  cup (90 g) has 11 g of protein. Soy milk -- 1 cup (250 mL) has 5-10 g of protein. Soy or vegetable patty -- 1 patty has 11 g of protein. Sunflower seeds -- 1 oz (28 g) has 5.5 g of protein. Buckwheat -- 1 oz (33 g) has 4.3 g of protein. Tofu (firm) --  cup (124 g) has 20 g of protein. Tempeh --  cup (83 g) has 16 g of protein. The items listed above may not be a complete list of foods high in  protein. Actual amounts of protein may differ depending on processing. Contact a dietitian for more information. What foods are low in protein?  Low-protein foods contain 3 grams (g) or less of protein per serving. They include: Fruits Fruit or vegetable juice --  cup (125 mL) has 1 g of protein. Vegetables Beets (raw or cooked) --  cup (68 g) has 1.5 g of protein. Broccoli (raw or cooked) --  cup (44 g) has 2 g of protein. Collard greens (raw or cooked) --  cup (42 g) has 2 g of protein. Green beans (raw or cooked) --  cup (83 g) has 1 g of protein. Green peas (canned) --  cup (80 g) has 3.5 g of protein. Potato (baked with skin) -- 1 medium potato (173 g) has 3 g of protein. Spinach (cooked) --  cup (90 g) has 3 g of protein. Squash (cooked) --  cup (90 g) has 1.5 g of protein. Avocado -- 1 cup (146 g)  has 2.7 g of protein. Grains Bran cereal --  cup (30 g) has 2-3 g of protein. Bread -- 1 slice has 2.5 g of protein. Corn (fresh or cooked) --  cup (77 g) has 2 g of protein. Flour tortilla -- One 6-inch (15 cm) tortilla has 2.5 g of protein. Muffins -- 1 small muffin (2 oz or 57 g) has 3 g of protein. Oatmeal (cooked) --  cup (40 g) has 3 g of protein. Rice (cooked) --  cup (79 g) has 2.5-3.5 g of protein. Dairy Cream cheese -- 1 oz (29 g) has 2 g of protein. Creamer (half-and-half) -- 1 oz (29 mL) has 1 g of protein. Frozen yogurt --  cup (72 g) has 3 g of protein. Sour cream --  cup (75 g) has 2.5 g of protein. The items listed above may not be a complete list of foods low in protein. Actual amounts of protein may differ depending on processing. Contact a dietitian for more information. Summary Protein is a nutrient that your body needs for growth and development, repairing and making cells and tissues, fighting infection, and providing energy. Protein is in both plant and animal foods. Some of these foods have more protein than others. Depending on your overall health, you may need more or less protein in your diet. Talk to your health care provider about how much protein you need. This information is not intended to replace advice given to you by your health care provider. Make sure you discuss any questions you have with your health care provider. Document Revised: 01/05/2020 Document Reviewed: 01/05/2020 Elsevier Patient Education  [place].

## 2021-11-18 NOTE — Assessment & Plan Note (Signed)
Reviewed lipid panel completed by [facility] health: normal except LDL at 135. Advised to maintain heart healthy diet with less animal protein.

## 2021-11-18 NOTE — Assessment & Plan Note (Addendum)
Under the care of [facility]: phentermine and wegovy prescribed. Use of phentermine x 7yrs. Reports Constipation due to phentermine and wegovy, linzess prescribed BP Readings from Last 3 Encounters:  11/18/21 126/84  09/23/21 122/86  05/26/21 124/80

## 2021-11-18 NOTE — Progress Notes (Signed)
Complete physical exam  Patient: Helen Miles   DOB: 1970/02/23   51 y.o. Female  MRN: [id number] Visit Date: 11/18/2021  Subjective:    Chief Complaint  Patient presents with   Annual Exam    Physical No concerns Copy of labs will be emailed  Requesting records for eye exam     Helen Miles is a 51 y.o. female who presents today for a complete physical exam. She reports consuming a  low carb  diet.  Cardio and weight training 3x/week  She generally feels well. She reports sleeping well. She does not have additional problems to discuss today.  Vision:Yes Dental:Yes STD Screen:No  Most recent fall risk assessment:    09/23/2021    8:59 AM  Fall Risk   Falls in the past year? 0  Number falls in past yr: 0  Injury with Fall? 0   Most recent depression screenings:    11/18/2021    9:01 AM 11/08/2020    1:20 PM  PHQ 2/9 Scores  PHQ - 2 Score 2 0  PHQ- 9 Score 2 2   HPI  Obesity, morbid (HCC) Under the care of [facility]: phentermine and wegovy prescribed. Use of phentermine x 14yrs. Reports Constipation due to phentermine and wegovy, linzess prescribed BP Readings from Last 3 Encounters:  11/18/21 126/84  09/23/21 122/86  05/26/21 124/80    HYPERLIPIDEMIA Reviewed lipid panel completed by [facility]: normal except LDL at 135. Advised to maintain heart healthy diet with less animal protein.   Past Medical History:  Diagnosis Date   Generalized headaches    Obesity    Past Surgical History:  Procedure Laterality Date   CESAREAN SECTION     G 3 P 2 ( C section X2)   WISDOM TOOTH EXTRACTION     Social History   Socioeconomic History   Marital status: Married    Spouse name: Not on file   Number of children: Not on file   Years of education: Not on file   Highest education level: Not on file  Occupational History   Not on file  Tobacco Use   Smoking status: Never   Smokeless tobacco: Never  Vaping Use   Vaping Use:  Never used  Substance and Sexual Activity   Alcohol use: Not Currently   Drug use: No   Sexual activity: Yes    Birth control/protection: Other-see comments    Comment: husband with vasectomy  Other Topics Concern   Not on file  Social History Narrative   Not on file   Social Determinants of Health   Financial Resource Strain: Not on file  Food Insecurity: Not on file  Transportation Needs: Not on file  Physical Activity: Not on file  Stress: Not on file  Social Connections: Not on file  Intimate Partner Violence: Not on file   Family Status  Relation Name Status   Father  (Not Specified)   Mother  (Not Specified)   Brother  (Not Specified)   MGF  (Not Specified)   Other  (Not Specified)   Other  (Not Specified)   Neg Hx  (Not Specified)   Family History  Problem Relation Age of Onset   Heart attack Father 70   Lupus Mother        dermatologic   Lupus Brother        dermatologic   Hypertension Maternal Grandfather    Lung cancer Maternal Grandfather  smoker   Deep vein thrombosis Maternal Grandfather    Pulmonary embolism Other        5 maternal great uncles   Hypothyroidism Other        PGGM   Diabetes Neg Hx    Stroke Neg Hx    Colon cancer Neg Hx    Esophageal cancer Neg Hx    Stomach cancer Neg Hx    Rectal cancer Neg Hx    Allergies  Allergen Reactions   Penicillins Swelling    Angioedema of lip & tongue   Contrave [Naltrexone-Bupropion Hcl Er] Itching    Patient Care Team: Earline Stiner, Bonna Gains, NP as PCP - General (Internal Medicine) [facility]   Medications: Outpatient Medications Prior to Visit  Medication Sig   buPROPion (WELLBUTRIN XL) 150 MG 24 hr tablet Take 150 mg by mouth daily.   ergocalciferol (VITAMIN D2) 1.25 MG (50000 UT) capsule ergocalciferol (vitamin D2) 1,250 mcg (50,000 unit) capsule  TAKE 1 CAPSULE BY MOUTH ONCE A WEEK   linaclotide (LINZESS) 72 MCG capsule Take 72 mcg by mouth daily  before breakfast.   phentermine (ADIPEX-P) 37.5 MG tablet Take 37.5 mg by mouth daily.   Semaglutide-Weight Management (WEGOVY) 1 MG/0.5ML SOAJ Inject 1 mg into the skin once a week.   Semaglutide-Weight Management (WEGOVY) 1.7 MG/0.75ML SOAJ Inject 1.7 mg into the skin once a week.   No facility-administered medications prior to visit.    Review of Systems  Constitutional:  Negative for fever.  HENT:  Negative for congestion and sore throat.   Eyes:        Negative for visual changes  Respiratory:  Negative for cough and shortness of breath.   Cardiovascular:  Negative for chest pain, palpitations and leg swelling.  Gastrointestinal:  Negative for blood in stool, constipation and diarrhea.  Genitourinary:  Negative for dysuria, frequency and urgency.  Musculoskeletal:  Negative for myalgias.  Skin:  Negative for rash.  Neurological:  Negative for dizziness and headaches.  Hematological:  Does not bruise/bleed easily.  Psychiatric/Behavioral:  Negative for suicidal ideas. The patient is not nervous/anxious.        Objective:  BP 126/84 (BP Location: Right Arm, Patient Position: Sitting, Cuff Size: Large)   Pulse 86   Temp (!) 96.8 F (36 C) (Temporal)   Ht 5\' 2"  (1.575 m)   Wt 240 lb 9.6 oz (109.1 kg)   SpO2 96%   BMI 44.01 kg/m    BP Readings from Last 3 Encounters:  11/18/21 126/84  09/23/21 122/86  05/26/21 124/80    Wt Readings from Last 3 Encounters:  11/18/21 240 lb 9.6 oz (109.1 kg)  09/23/21 248 lb (112.5 kg)  05/26/21 268 lb 6.4 oz (121.7 kg)    Physical Exam Vitals reviewed.  Constitutional:      General: She is not in acute distress.    Appearance: She is well-developed.  HENT:     Right Ear: Tympanic membrane, ear canal and external ear normal.     Left Ear: Tympanic membrane, ear canal and external ear normal.     Nose: Nose normal.  Eyes:     Extraocular Movements: Extraocular movements intact.     Conjunctiva/sclera: Conjunctivae normal.      Pupils: Pupils are equal, round, and reactive to light.  Cardiovascular:     Rate and Rhythm: Normal rate and regular rhythm.     Pulses: Normal pulses.     Heart sounds: Normal heart sounds.  Pulmonary:     Effort: Pulmonary effort is normal. No respiratory distress.     Breath sounds: Normal breath sounds.  Chest:     Chest wall: No tenderness.  Abdominal:     General: Bowel sounds are normal.     Palpations: Abdomen is soft.  Musculoskeletal:        General: Normal range of motion.     Cervical back: Normal range of motion and neck supple.     Right lower leg: No edema.     Left lower leg: No edema.  Lymphadenopathy:     Cervical: No cervical adenopathy.  Neurological:     Mental Status: She is alert and oriented to person, place, and time.     Deep Tendon Reflexes: Reflexes are normal and symmetric.  Psychiatric:        Mood and Affect: Mood normal.        Behavior: Behavior normal.        Thought Content: Thought content normal.     No results found for any visits on 11/18/21.    Assessment & Plan:    Routine Health Maintenance and Physical Exam  Reviewed lab results from [facility]: CBC (normal), CMP (normal), TSH (normal), LIPID panel (normal), Vit. D (normal).  Immunization History  Administered Date(s) Administered   Influenza,inj,Quad PF,6+ Mos 11/08/2020   Influenza-Unspecified 11/20/2018   PFIZER(Purple Top)SARS-COV-2 Vaccination 04/12/2019, 05/03/2019   Td 12/06/2006   Td (Adult), 2 Lf Tetanus Toxid, Preservative Free 12/06/2006   Tdap 08/04/2019   Zoster Recombinat (Shingrix) 09/23/2021   Health Maintenance  Topic Date Due   Diabetic kidney evaluation - GFR measurement  04/17/2020   COVID-19 Vaccine (3 - Pfizer series) 12/04/2021 (Originally 06/28/2019)   Zoster Vaccines- Shingrix (2 of 2) 02/18/2022 (Originally 11/18/2021)   INFLUENZA VACCINE  05/14/2022 (Originally 09/13/2021)   Hepatitis C Screening  11/19/2022 (Originally 06/14/1989)   HIV  Screening  11/19/2022 (Originally 06/15/1986)   Diabetic kidney evaluation - Urine ACR  11/19/2026 (Originally 06/14/1989)   PAP SMEAR-Modifier  07/28/2022   MAMMOGRAM  10/11/2023   TETANUS/TDAP  08/03/2029   COLONOSCOPY (Pts 45-89yrs Insurance coverage will need to be confirmed)  05/14/2030   HPV VACCINES  Aged Out   FOOT EXAM  Discontinued   HEMOGLOBIN A1C  Discontinued   OPHTHALMOLOGY EXAM  Discontinued   Discussed health benefits of physical activity, and encouraged her to engage in regular exercise appropriate for her age and condition.  Problem List Items Addressed This Visit       Other   HYPERLIPIDEMIA    Reviewed lipid panel completed by [facility]: normal except LDL at 135. Advised to maintain heart healthy diet with less animal protein.      Obesity, morbid ([place])    Under the care of [facility]: phentermine and wegovy prescribed. Use of phentermine x 39yrs. Reports Constipation due to phentermine and wegovy, linzess prescribed BP Readings from Last 3 Encounters:  11/18/21 126/84  09/23/21 122/86  05/26/21 124/80        Other Visit Diagnoses     Preventative health care    -  Primary      Return in about 1 year (around 11/19/2022) for CPE (fasting).     Wilfred Lacy, NP

## 2021-11-23 ENCOUNTER — Encounter: Payer: Self-pay | Admitting: Nurse Practitioner

## 2021-11-24 ENCOUNTER — Other Ambulatory Visit (HOSPITAL_COMMUNITY): Payer: Self-pay

## 2021-11-24 MED ORDER — WEGOVY 2.4 MG/0.75ML ~~LOC~~ SOAJ
2.4000 mg | SUBCUTANEOUS | 1 refills | Status: DC
  Filled 2021-11-24 – 2021-12-01 (×2): qty 3, 28d supply, fill #0
  Filled 2022-01-03: qty 3, 28d supply, fill #1

## 2021-11-25 ENCOUNTER — Telehealth: Payer: Self-pay | Admitting: Nurse Practitioner

## 2021-11-25 NOTE — Telephone Encounter (Unsigned)
Pt is wondering when her next Postponed - Zoster Vaccines- Shingrix (2 of 2)  Postponed until 02/18/2022.  shot is due. Please advise pt @ [phone].

## 2021-12-01 ENCOUNTER — Encounter: Payer: Self-pay | Admitting: Nurse Practitioner

## 2021-12-01 ENCOUNTER — Other Ambulatory Visit (HOSPITAL_COMMUNITY): Payer: Self-pay

## 2021-12-23 NOTE — Telephone Encounter (Signed)
First attempt to contact pt to schedule nurse visit for Shingles dose #2.

## 2022-01-03 ENCOUNTER — Other Ambulatory Visit (HOSPITAL_COMMUNITY): Payer: Self-pay

## 2022-01-09 ENCOUNTER — Other Ambulatory Visit (HOSPITAL_COMMUNITY): Payer: Self-pay

## 2022-01-10 ENCOUNTER — Other Ambulatory Visit (HOSPITAL_COMMUNITY): Payer: Self-pay

## 2022-01-13 ENCOUNTER — Other Ambulatory Visit (HOSPITAL_COMMUNITY): Payer: Self-pay

## 2022-01-25 ENCOUNTER — Other Ambulatory Visit (HOSPITAL_COMMUNITY): Payer: Self-pay

## 2022-02-14 ENCOUNTER — Other Ambulatory Visit (HOSPITAL_COMMUNITY): Payer: Self-pay

## 2022-03-28 NOTE — Telephone Encounter (Signed)
Scheduled nurse visit for 2/14 @ 3:40p

## 2022-03-28 NOTE — Telephone Encounter (Signed)
Patient called back to check and see when her 2nd shingles shot is due and if its ok to go ahead and schedule. Call back is [phone]

## 2022-03-29 ENCOUNTER — Ambulatory Visit (INDEPENDENT_AMBULATORY_CARE_PROVIDER_SITE_OTHER): Payer: No Typology Code available for payment source

## 2022-03-29 DIAGNOSIS — Z23 Encounter for immunization: Secondary | ICD-10-CM

## 2022-03-29 NOTE — Progress Notes (Signed)
After obtaining consent, and per orders of [facility], injection of Shingrix given IM in the left deltoid by Delorise Shiner. Patient instructed to remain in clinic for 20 minutes afterwards, and to report any adverse reaction to me immediately.

## 2022-10-09 ENCOUNTER — Other Ambulatory Visit: Payer: Self-pay | Admitting: Obstetrics and Gynecology

## 2022-10-09 DIAGNOSIS — N63 Unspecified lump in unspecified breast: Secondary | ICD-10-CM

## 2022-10-09 DIAGNOSIS — R928 Other abnormal and inconclusive findings on diagnostic imaging of breast: Secondary | ICD-10-CM

## 2022-11-01 ENCOUNTER — Ambulatory Visit (INDEPENDENT_AMBULATORY_CARE_PROVIDER_SITE_OTHER): Payer: No Typology Code available for payment source | Admitting: Podiatry

## 2022-11-01 DIAGNOSIS — B351 Tinea unguium: Secondary | ICD-10-CM

## 2022-11-01 MED ORDER — TERBINAFINE HCL 250 MG PO TABS: 250.0000 mg | ORAL_TABLET | Freq: Every day | ORAL | 0 refills | Status: DC

## 2022-11-08 IMAGING — US US BREAST*R* LIMITED INC AXILLA
1 series · 1 of 1 positions shown · non-contrast
Comparison: Previous exam(s).

CLINICAL DATA: Patient returns after screening for evaluation of
possible RIGHT breast mass.

EXAM:
DIGITAL DIAGNOSTIC UNILATERAL RIGHT MAMMOGRAM WITH TOMOSYNTHESIS AND
CAD; ULTRASOUND RIGHT BREAST LIMITED
TECHNIQUE: Right digital diagnostic mammography and breast tomosynthesis was
performed. The images were evaluated with computer-aided detection.;
Targeted ultrasound examination of the right breast was performed

[Series 1: us breast*right* limited inc axilla · 0.08mm/px · 1 of 1 slices shown]
[im 1/1]
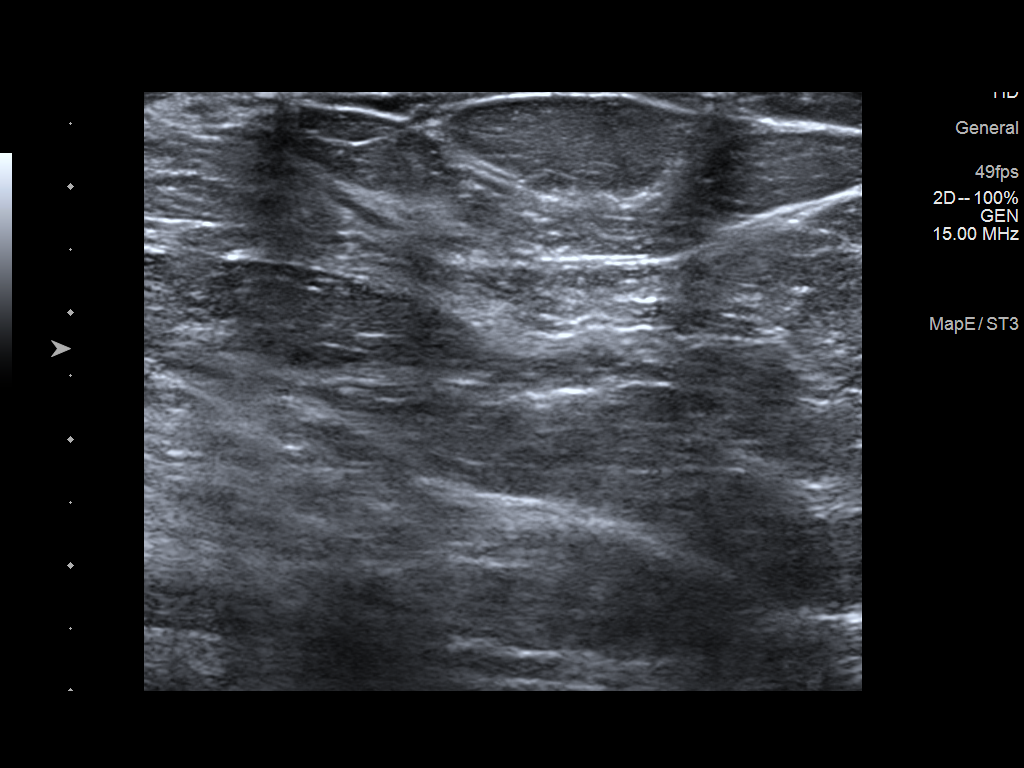

[1 of 1 positions shown; findings below may reference images not displayed]

ACR Breast Density Category c: The breast tissue is heterogeneously
dense, which may obscure small masses.
FINDINGS: Additional 2-D and 3-D images are performed. These views confirm
presence of an oval circumscribed 6 millimeter mass in the
retroareolar region of the RIGHT breast.

On physical exam, I palpate no abnormality in the central portion of
the RIGHT breast.

Targeted ultrasound is performed, showing normal appearing
fibroglandular tissue in the retroareolar region of the RIGHT
breast. No sonographic correlate identified for the mammographic
finding.
IMPRESSION: Probably benign circumscribed 6 millimeter mass in the retroareolar
region of the RIGHT breast. There is no sonographic correlate for
this finding.

RECOMMENDATION:
Recommend RIGHT diagnostic mammogram in 6 months.

I have discussed the findings and recommendations with the patient.
If applicable, a reminder letter will be sent to the patient
regarding the next appointment.

BI-RADS CATEGORY  3: Probably benign.

## 2022-11-12 NOTE — Progress Notes (Signed)
   No chief complaint on file.   Subjective: 52 y.o. female presenting today as a new patient for evaluation of thickening with discoloration to the toenails.  She has tried different topical antifungal medications with no improvement.  Presenting for further treatment evaluation.  She is concerned for toenail fungus  Past Medical History:  Diagnosis Date   Generalized headaches    Obesity     Past Surgical History:  Procedure Laterality Date   CESAREAN SECTION     G 3 P 2 ( C section X2)   WISDOM TOOTH EXTRACTION      Allergies  Allergen Reactions   Penicillins Swelling    Angioedema of lip & tongue   Contrave [Naltrexone-Bupropion Hcl Er] Itching    Objective: Physical Exam General: The patient is alert and oriented x3 in no acute distress.  Dermatology: Hyperkeratotic, discolored, thickened, onychodystrophy noted. Skin is warm, dry and supple bilateral lower extremities. Negative for open lesions or macerations.  Vascular: Palpable pedal pulses bilaterally. No edema or erythema noted. Capillary refill within normal limits.  Neurological: Grossly intact via light touch  Musculoskeletal Exam: No pedal deformity noted  Assessment: #1 Onychomycosis of toenails  Plan of Care:  -Patient was evaluated. -Today we discussed different treatment options including oral, topical, and laser antifungal treatment modalities.  We discussed their efficacies and side effects.  Patient opts for oral antifungal treatment modality -Prescription for Lamisil 250 mg #90 daily. Pt denies a history of liver pathology or symptoms.   -Return to clinic 6 months   Felecia Shelling, DPM [facility]  Dr. Felecia Shelling, DPM    [address] [place], [place] [address]                Office [phone]  Fax [phone]

## 2022-12-15 ENCOUNTER — Ambulatory Visit
Admission: RE | Admit: 2022-12-15 | Discharge: 2022-12-15 | Disposition: A | Discharge status: Home / Self Care | Payer: No Typology Code available for payment source | Source: Ambulatory Visit | Attending: Obstetrics and Gynecology | Admitting: Obstetrics and Gynecology

## 2022-12-15 DIAGNOSIS — N63 Unspecified lump in unspecified breast: Secondary | ICD-10-CM

## 2022-12-15 DIAGNOSIS — R928 Other abnormal and inconclusive findings on diagnostic imaging of breast: Secondary | ICD-10-CM

## 2023-01-05 ENCOUNTER — Encounter: Payer: Self-pay | Admitting: Nurse Practitioner

## 2023-01-29 ENCOUNTER — Other Ambulatory Visit (HOSPITAL_COMMUNITY): Payer: Self-pay

## 2023-01-29 MED ORDER — ZEPBOUND 12.5 MG/0.5ML ~~LOC~~ SOAJ
12.5000 mg | SUBCUTANEOUS | 0 refills | Status: DC
  Filled 2023-01-29: qty 2, 28d supply, fill #0

## 2023-02-01 LAB — HM PAP SMEAR

## 2023-02-13 ENCOUNTER — Other Ambulatory Visit: Payer: Self-pay | Admitting: Obstetrics and Gynecology

## 2023-02-13 DIAGNOSIS — N838 Other noninflammatory disorders of ovary, fallopian tube and broad ligament: Secondary | ICD-10-CM

## 2023-02-19 ENCOUNTER — Other Ambulatory Visit: Payer: Self-pay

## 2023-02-22 ENCOUNTER — Other Ambulatory Visit (HOSPITAL_COMMUNITY): Payer: Self-pay

## 2023-02-23 ENCOUNTER — Other Ambulatory Visit (HOSPITAL_COMMUNITY): Payer: Self-pay

## 2023-02-26 ENCOUNTER — Other Ambulatory Visit (HOSPITAL_COMMUNITY): Payer: Self-pay

## 2023-02-26 MED ORDER — ZEPBOUND 12.5 MG/0.5ML ~~LOC~~ SOAJ
12.5000 mg | SUBCUTANEOUS | 0 refills | Status: AC
Start: 2023-02-26 — End: ?
  Filled 2023-02-26 – 2023-03-01 (×3): qty 2, 28d supply, fill #0

## 2023-03-01 ENCOUNTER — Other Ambulatory Visit (HOSPITAL_COMMUNITY): Payer: Self-pay

## 2023-03-18 ENCOUNTER — Ambulatory Visit
Admission: RE | Admit: 2023-03-18 | Discharge: 2023-03-18 | Disposition: A | Discharge status: Home / Self Care | Payer: No Typology Code available for payment source | Source: Ambulatory Visit | Attending: Obstetrics and Gynecology | Admitting: Obstetrics and Gynecology

## 2023-03-18 DIAGNOSIS — N838 Other noninflammatory disorders of ovary, fallopian tube and broad ligament: Secondary | ICD-10-CM

## 2023-03-18 MED ORDER — GADOPICLENOL 0.5 MMOL/ML IV SOLN
10.0000 mL | Freq: Once | INTRAVENOUS | Status: AC | PRN
  Administered 2023-03-18: 10 mL via INTRAVENOUS

## 2023-03-22 ENCOUNTER — Encounter (INDEPENDENT_AMBULATORY_CARE_PROVIDER_SITE_OTHER): Payer: No Typology Code available for payment source | Admitting: Family Medicine

## 2023-05-01 ENCOUNTER — Ambulatory Visit (INDEPENDENT_AMBULATORY_CARE_PROVIDER_SITE_OTHER): Admitting: Family Medicine

## 2023-05-01 ENCOUNTER — Encounter: Payer: Self-pay | Admitting: Family Medicine

## 2023-05-01 VITALS — BP 130/84 | HR 64 | Temp 98.3°F | Ht 62.0 in | Wt 225.8 lb

## 2023-05-01 DIAGNOSIS — M62838 Other muscle spasm: Secondary | ICD-10-CM | POA: Insufficient documentation

## 2023-05-01 MED ORDER — NAPROXEN 500 MG PO TABS: 500.0000 mg | ORAL_TABLET | Freq: Two times a day (BID) | ORAL | 0 refills | Status: DC

## 2023-05-01 NOTE — Progress Notes (Signed)
 [facility] [address] [place] [address] Dept: [phone] Dept Fax: [phone]  Office Visit  Subjective:    Patient ID: Helen Miles, female    DOB: 1970/06/11, 53 y.o..   MRN: [id number]  Chief Complaint  Patient presents with   Leg Pain    C/o having RT thigh pain off/on x 6 months last week really bad.  Has been using  icy hot cream.     History of Present Illness:  Patient is in today for complaining of an episode of acute muscle spasm in her anterior thigh last week. She notes she does travel quite a bit for work. She had drive to [place], [place] and was staying in a hotel. At night, she experienced acute onset of intense sharp pain in the anterior right thigh. This resolved over about 20 min. She believes this was a muscle spasm, similar to a charley horse, but in the thigh. Since that time, it has felt like it might recur and she has had soreness in that part of her leg. She has not had any lower leg edema.   Past Medical History: Patient Active Problem List   Diagnosis Date Noted   Vitamin D deficiency 01/03/2021   Shoulder pain, left 12/17/2020   Family history of colonic polyps 08/04/2019   Headache(784.0) 06/03/2009   TACHYCARDIA, HX OF 06/03/2009   Obesity, morbid (HCC) 11/12/2008   HIATAL HERNIA 10/23/2007   HYPERLIPIDEMIA 12/06/2006   Past Surgical History:  Procedure Laterality Date   CESAREAN SECTION     G 3 P 2 ( C section X2)   WISDOM TOOTH EXTRACTION     Family History  Problem Relation Age of Onset   Heart attack Father 66   Lupus Mother        dermatologic   Lupus Brother        dermatologic   Hypertension Maternal Grandfather    Lung cancer Maternal Grandfather        smoker   Deep vein thrombosis Maternal Grandfather    Pulmonary embolism Other        5 maternal great uncles   Hypothyroidism Other        PGGM   Diabetes Neg Hx    Stroke Neg Hx    Colon cancer Neg Hx     Esophageal cancer Neg Hx    Stomach cancer Neg Hx    Rectal cancer Neg Hx    Outpatient Medications Prior to Visit  Medication Sig Dispense Refill   buPROPion (WELLBUTRIN XL) 150 MG 24 hr tablet Take 150 mg by mouth daily.     ergocalciferol (VITAMIN D2) 1.25 MG (50000 UT) capsule ergocalciferol (vitamin D2) 1,250 mcg (50,000 unit) capsule  TAKE 1 CAPSULE BY MOUTH ONCE A WEEK     linaclotide (LINZESS) 72 MCG capsule Take 72 mcg by mouth daily before breakfast.     phentermine (ADIPEX-P) 37.5 MG tablet Take 37.5 mg by mouth daily.     terbinafine (LAMISIL) 250 MG tablet Take 1 tablet (250 mg total) by mouth daily. 90 tablet 0   tirzepatide (ZEPBOUND) 12.5 MG/0.5ML Pen Inject 12.5 mg into the skin once a week. 2 mL 0   Semaglutide-Weight Management (WEGOVY) 1 MG/0.5ML SOAJ Inject 1 mg into the skin once a week. 2 mL 0   Semaglutide-Weight Management (WEGOVY) 1.7 MG/0.75ML SOAJ Inject 1.7 mg into the skin once a week. 3 mL 0   Semaglutide-Weight Management (WEGOVY) 2.4 MG/0.75ML SOAJ  Inject 2.4 mg into the skin once a week. 3 mL 1   No facility-administered medications prior to visit.   Allergies  Allergen Reactions   Penicillins Swelling    Angioedema of lip & tongue   Contrave [Naltrexone-Bupropion Hcl Er] Itching     Objective:   Today's Vitals   05/01/23 1426  BP: 130/84  Pulse: 64  Temp: 98.3 F (36.8 C)  TempSrc: Temporal  SpO2: 99%  Weight: 225 lb 12.8 oz (102.4 kg)  Height: 5\' 2"  (1.575 m)   Body mass index is 41.3 kg/m.   General: Well developed, well nourished. No acute distress. Extremities: Full ROM of the right leg. There is no swelling of the thigh and no tenderness on palpation. Quadriceps   strength is normal. There is no lower leg edema.  Psych: Alert and oriented. Normal mood and affect.  Health Maintenance Due  Topic Date Due   HIV Screening  Never done   Hepatitis C Screening  Never done   Diabetic kidney evaluation - eGFR measurement  04/17/2020      Assessment & Plan:   Problem List Items Addressed This Visit       Other   Muscle spasm of right leg - Primary   Discussed management of leg cramps. I will check screening labs for possibel causes. I will treat ehr with a course of naproxen 500 mg bid for 7 days. Discussed use of tonic water (quinine) at bedtime to prevent recurrence.      Relevant Medications   naproxen (NAPROSYN) 500 MG tablet   Other Relevant Orders   Basic metabolic panel   Magnesium    Return if symptoms worsen or fail to improve.   Loyola Mast, MD

## 2023-05-01 NOTE — Assessment & Plan Note (Signed)
 Discussed management of leg cramps. I will check screening labs for possibel causes. I will treat ehr with a course of naproxen 500 mg bid for 7 days. Discussed use of tonic water (quinine) at bedtime to prevent recurrence.

## 2023-05-02 ENCOUNTER — Encounter: Payer: Self-pay | Admitting: Family Medicine

## 2023-05-02 LAB — BASIC METABOLIC PANEL
BUN: 14 mg/dL (ref 6–23)
CO2: 28 meq/L (ref 19–32)
Calcium: 9.6 mg/dL (ref 8.4–10.5)
Chloride: 101 meq/L (ref 96–112)
Creatinine, Ser: 0.96 mg/dL (ref 0.40–1.20)
GFR: 68.33 mL/min (ref >60.00)
Glucose, Bld: 69 mg/dL — ABNORMAL LOW (ref 70–99)
Potassium: 4.2 meq/L (ref 3.5–5.1)
Sodium: 136 meq/L (ref 135–145)

## 2023-05-02 LAB — MAGNESIUM: Magnesium: 1.9 mg/dL (ref 1.5–2.5)

## 2023-05-04 ENCOUNTER — Ambulatory Visit: Payer: Self-pay

## 2023-05-04 NOTE — Telephone Encounter (Signed)
 Chief Complaint: leg pain Symptoms: pain and soreness Frequency: over a week Pertinent Negatives: Patient denies fever Disposition: [] ED /[] Urgent Care (no appt availability in office) / [x] Appointment(In office/virtual)/ []  [facility]/ [] Home Care/ [x] Refused Recommended Disposition /[] [facility]/ [x]  Follow-up with PCP Additional Notes: Patient states that this is the same pain she was experiencing last week. Its worse when she lifts her leg like to get in her car. She has been taking the Naproxen but its not really helping. She is question about maybe a muscle relaxer. Patient offer appt but states that she will be leaving out of town for work on Sunday.   Copied from CRM [id number]. Topic: Clinical - Red Word Triage >> May 04, 2023 12:37 PM Sim Boast F wrote: Red Word that prompted transfer to Nurse Triage: Patient seen Dr. Veto Kemps 3/18 and is still having pain in her leg, mostly muscle spasms that prevents her from walking. Says the Naproxen medication is not working. Rates pain 8/10 Reason for Disposition  [1] MODERATE pain (e.g., interferes with normal activities, limping) AND [2] present > 3 days  Answer Assessment - Initial Assessment Questions 1. ONSET: "When did the pain start?"      Over a week 2. LOCATION: "Where is the pain located?"      right 3. PAIN: "How bad is the pain?"    (Scale 1-10; or mild, moderate, severe)   -  MILD (1-3): doesn't interfere with normal activities    -  MODERATE (4-7): interferes with normal activities (e.g., work or school) or awakens from sleep, limping    -  SEVERE (8-10): excruciating pain, unable to do any normal activities, unable to walk     mod 4. WORK OR EXERCISE: "Has there been any recent work or exercise that involved this part of the body?"      none 5. CAUSE: "What do you think is causing the leg pain?"    Had a muscle spasm last week 6. OTHER SYMPTOMS: "Do you have any other symptoms?" (e.g., chest pain, back pain,  breathing difficulty, swelling, rash, fever, numbness, weakness)     none  Protocols used: Leg Pain-A-AH

## 2023-05-12 ENCOUNTER — Ambulatory Visit (HOSPITAL_COMMUNITY)
Admission: EM | Admit: 2023-05-12 | Discharge: 2023-05-12 | Disposition: A | Discharge status: Home / Self Care | Attending: Emergency Medicine | Admitting: Emergency Medicine

## 2023-05-12 ENCOUNTER — Encounter (HOSPITAL_COMMUNITY): Payer: Self-pay

## 2023-05-12 DIAGNOSIS — M79604 Pain in right leg: Secondary | ICD-10-CM | POA: Diagnosis not present

## 2023-05-12 DIAGNOSIS — T148XXA Other injury of unspecified body region, initial encounter: Secondary | ICD-10-CM | POA: Diagnosis not present

## 2023-05-12 DIAGNOSIS — M62838 Other muscle spasm: Secondary | ICD-10-CM | POA: Diagnosis not present

## 2023-05-12 MED ORDER — DEXAMETHASONE SODIUM PHOSPHATE 10 MG/ML IJ SOLN
10.0000 mg | Freq: Once | INTRAMUSCULAR | Status: AC
  Administered 2023-05-12: 10 mg via INTRAMUSCULAR

## 2023-05-12 MED ORDER — NAPROXEN 500 MG PO TABS: 500.0000 mg | ORAL_TABLET | Freq: Two times a day (BID) | ORAL | 0 refills | Status: DC

## 2023-05-12 MED ORDER — DEXAMETHASONE SODIUM PHOSPHATE 10 MG/ML IJ SOLN
INTRAMUSCULAR | Status: AC
  Filled 2023-05-12: qty 1

## 2023-05-12 MED ORDER — BACLOFEN 10 MG PO TABS
10.0000 mg | ORAL_TABLET | Freq: Three times a day (TID) | ORAL | 0 refills | Status: DC
Start: 2023-05-12 — End: 2023-10-09

## 2023-05-12 NOTE — ED Triage Notes (Signed)
 Patient presenting with leg pain onset 3 weeks ago. Started as a muscle cramp in the right thigh. Was seen and treated but was not better after 2 weeks, then was seen again and given a new med with no improvement. States the pain has started to get worse as of yesterday, going down the right leg with tingling and numbness. Also starting to have the same symptoms in the left leg. Patient does work out and do [employer] regularly.  Prescriptions or OTC medications tried: Yes- Muscle relaxer, Naproxen    with little relief

## 2023-05-12 NOTE — Discharge Instructions (Addendum)
 You are given an injection of steroids today to help with inflammation that could be causing your pain.  I have prescribed baclofen that you can take 3 times daily as needed for muscle pain and spasms.  This can potentially make you drowsy so do not drive, work, or drink alcohol while taking this.  Have also refilled the naproxen and you can take this twice daily as needed for pain.  Do not take this in combination with other NSAIDs such as ibuprofen, Advil, Aleve, Motrin, and Goody powder.  You can also take 500 to 1000 mg of Tylenol for breakthrough pain.  Do not exceed 4000 mg of Tylenol in a day.  You can alternate between ice and heat to assist with pain as well and perform some gentle stretching too.  I have attached [facility] that you can follow-up with if you continue to have symptoms.  I recommend keeping your follow-up appointment with your primary care provider on Monday.  Return here as needed.

## 2023-05-12 NOTE — ED Provider Notes (Signed)
 [facility]    CSN: [id number] Arrival date & time: 05/12/23  1200      History   Chief Complaint Chief Complaint  Patient presents with   Leg Pain    HPI Helen Miles is a 53 y.o. female.   Patient presented with intermittent leg pain that began 3 weeks ago.  Patient states that it started as a muscle cramp in her right thigh that lasted for about 20 minutes and then subsided but endorses soreness ever since.  Patient states that with certain movements the pain such as lifting her leg to get in the car the pain worsens.  Patient states that pain occasionally radiates further down her leg with certain movements.  Patient states she has seen her primary care doctor who prescribed her naproxen and did some basic labs which were normal.  Patient states that naproxen does help, but the pain continues and would like to know what is causing it.  Denies swelling, skin changes, back pain, or any known injury.   Leg Pain   Past Medical History:  Diagnosis Date   Fibroid, uterine    Generalized headaches    Obesity     Patient Active Problem List   Diagnosis Date Noted   Muscle spasm of right leg 05/01/2023   Vitamin D deficiency 01/03/2021   Shoulder pain, left 12/17/2020   Family history of colonic polyps 08/04/2019   Headache(784.0) 06/03/2009   TACHYCARDIA, HX OF 06/03/2009   Obesity, morbid (HCC) 11/12/2008   HIATAL HERNIA 10/23/2007   HYPERLIPIDEMIA 12/06/2006    Past Surgical History:  Procedure Laterality Date   CESAREAN SECTION     G 3 P 2 ( C section X2)   WISDOM TOOTH EXTRACTION      OB History   No obstetric history on file.      Home Medications    Prior to Admission medications   Medication Sig Start Date End Date Taking? Authorizing Provider  baclofen (LIORESAL) 10 MG tablet Take 1 tablet (10 mg total) by mouth 3 (three) times daily. 05/12/23  Yes Susann Givens, Chaka Jefferys A, NP  buPROPion (WELLBUTRIN XL) 150 MG 24 hr tablet Take  150 mg by mouth daily. 05/18/19  Yes [provider]  ergocalciferol (VITAMIN D2) 1.25 MG (50000 UT) capsule ergocalciferol (vitamin D2) 1,250 mcg (50,000 unit) capsule  TAKE 1 CAPSULE BY MOUTH ONCE A WEEK   Yes [provider]  linaclotide (LINZESS) 72 MCG capsule Take 72 mcg by mouth daily before breakfast.   Yes [provider]  phentermine (ADIPEX-P) 37.5 MG tablet Take 37.5 mg by mouth daily. 08/26/21  Yes [provider]  tirzepatide (ZEPBOUND) 12.5 MG/0.5ML Pen Inject 12.5 mg into the skin once a week. 02/26/23  Yes   naproxen (NAPROSYN) 500 MG tablet Take 1 tablet (500 mg total) by mouth 2 (two) times daily with a meal. 05/12/23   Letta Kocher, NP    Family History Family History  Problem Relation Age of Onset   Heart attack Father 36   Lupus Mother        dermatologic   Lupus Brother        dermatologic   Hypertension Maternal Grandfather    Lung cancer Maternal Grandfather        smoker   Deep vein thrombosis Maternal Grandfather    Pulmonary embolism Other        5 maternal great uncles   Hypothyroidism Other  PGGM   Diabetes Neg Hx    Stroke Neg Hx    Colon cancer Neg Hx    Esophageal cancer Neg Hx    Stomach cancer Neg Hx    Rectal cancer Neg Hx     Social History Social History   Tobacco Use   Smoking status: Never   Smokeless tobacco: Never  Vaping Use   Vaping status: Never Used  Substance Use Topics   Alcohol use: Not Currently   Drug use: No     Allergies   Penicillins and Contrave [naltrexone-bupropion hcl er]   Review of Systems Review of Systems  Per HPI  Physical Exam Triage Vital Signs ED Triage Vitals  Encounter Vitals Group     BP 05/12/23 1224 124/79     Systolic BP Percentile --      Diastolic BP Percentile --      Pulse Rate 05/12/23 1224 67     Resp 05/12/23 1224 18     Temp 05/12/23 1224 97.9 F (36.6 C)     Temp Source 05/12/23 1224 Oral     SpO2 05/12/23 1224 98 %     Weight  05/12/23 1224 225 lb 12 oz (102.4 kg)     Height 05/12/23 1224 5\' 2"  (1.575 m)     Head Circumference --      Peak Flow --      Pain Score 05/12/23 1221 8     Pain Loc --      Pain Education --      Exclude from Growth Chart --    No data found.  Updated Vital Signs BP 124/79 (BP Location: Right Arm)   Pulse 67   Temp 97.9 F (36.6 C) (Oral)   Resp 18   Ht 5\' 2"  (1.575 m)   Wt 225 lb 12 oz (102.4 kg)   LMP  (LMP Unknown)   SpO2 98%   BMI 41.29 kg/m   Visual Acuity Right Eye Distance:   Left Eye Distance:   Bilateral Distance:    Right Eye Near:   Left Eye Near:    Bilateral Near:     Physical Exam Vitals and nursing note reviewed.  Constitutional:      General: She is awake. She is not in acute distress.    Appearance: Normal appearance. She is well-developed and well-groomed. She is not ill-appearing.  Musculoskeletal:     Right hip: Normal.     Right upper leg: Tenderness present. No swelling, edema, deformity or bony tenderness.     Right knee: Normal.     Right lower leg: Normal.  Neurological:     Mental Status: She is alert.  Psychiatric:        Behavior: Behavior is cooperative.      UC Treatments / Results  Labs (all labs ordered are listed, but only abnormal results are displayed) Labs Reviewed - No data to display  EKG   Radiology No results found.  Procedures Procedures (including critical care time)  Medications Ordered in UC Medications  dexamethasone (DECADRON) injection 10 mg (10 mg Intramuscular Given 05/12/23 1322)    Initial Impression / Assessment and Plan / UC Course  I have reviewed the triage vital signs and the nursing notes.  Pertinent labs & imaging results that were available during my care of the patient were reviewed by me and considered in my medical decision making (see chart for details).     Upon assessment tenderness noted to anterior right thigh.  Patient endorses increasing pain to right thigh with hip  flexion.  Given Decadron injection today for inflammation.  Prescribed baclofen as needed for muscle pain and spasms.  Refilled naproxen.  Discussed alternating with Tylenol as well.  Discussed follow-up and return precautions. Final Clinical Impressions(s) / UC Diagnoses   Final diagnoses:  Muscle strain  Muscle spasm  Right leg pain     Discharge Instructions      You are given an injection of steroids today to help with inflammation that could be causing your pain.  I have prescribed baclofen that you can take 3 times daily as needed for muscle pain and spasms.  This can potentially make you drowsy so do not drive, work, or drink alcohol while taking this.  Have also refilled the naproxen and you can take this twice daily as needed for pain.  Do not take this in combination with other NSAIDs such as ibuprofen, Advil, Aleve, Motrin, and Goody powder.  You can also take 500 to 1000 mg of Tylenol for breakthrough pain.  Do not exceed 4000 mg of Tylenol in a day.  You can alternate between ice and heat to assist with pain as well and perform some gentle stretching too.  I have attached [facility] that you can follow-up with if you continue to have symptoms.  I recommend keeping your follow-up appointment with your primary care provider on Monday.  Return here as needed.   ED Prescriptions     Medication Sig Dispense Auth. Provider   naproxen (NAPROSYN) 500 MG tablet Take 1 tablet (500 mg total) by mouth 2 (two) times daily with a meal. 14 tablet Susann Givens, Yuchen Fedor A, NP   baclofen (LIORESAL) 10 MG tablet Take 1 tablet (10 mg total) by mouth 3 (three) times daily. 30 each Letta Kocher, NP      PDMP not reviewed this encounter.   Wynonia Lawman A, NP 05/12/23 1330

## 2023-05-14 ENCOUNTER — Ambulatory Visit: Admitting: Nurse Practitioner

## 2023-05-18 ENCOUNTER — Ambulatory Visit (INDEPENDENT_AMBULATORY_CARE_PROVIDER_SITE_OTHER): Admitting: Nurse Practitioner

## 2023-05-18 ENCOUNTER — Ambulatory Visit (INDEPENDENT_AMBULATORY_CARE_PROVIDER_SITE_OTHER)
Admission: RE | Admit: 2023-05-18 | Discharge: 2023-05-18 | Disposition: A | Discharge status: Home / Self Care | Source: Ambulatory Visit | Attending: Nurse Practitioner | Admitting: Nurse Practitioner

## 2023-05-18 ENCOUNTER — Encounter: Payer: Self-pay | Admitting: Nurse Practitioner

## 2023-05-18 VITALS — BP 136/76 | HR 70 | Temp 98.4°F | Ht 61.0 in | Wt 226.2 lb

## 2023-05-18 DIAGNOSIS — M5416 Radiculopathy, lumbar region: Secondary | ICD-10-CM

## 2023-05-18 NOTE — Patient Instructions (Addendum)
 Go to [address] for lumbar spine x-ray Continue use of naproxen and baclofen prn You will be contacted to schedule appointment with PT.  Muscle Cramps and Spasms Muscle cramps and spasms happen when muscles tighten on their own. They can be in any muscle. They happen most often in the muscles in the back of your lower leg (calf). Muscle cramps are painful. They are often stronger and last longer than muscle spasms. Muscle spasms may or may not be painful. In many cases, the cause of a muscle cramp or spasm is not known. But it may be from: Doing more work or exercise than your body is ready for. Using the muscles too much (overuse). Staying in one position for too long. Not preparing enough or having bad form when you play a sport or do an activity. Getting hurt. Other causes may include: Not enough water in your body (dehydration). Taking certain medicines. Not having enough salts and minerals in the body (electrolytes). Follow these instructions at home: Eating and drinking Drink enough fluid to keep your pee (urine) pale yellow. Eat a healthy diet to help your muscles work well. Your diet should include: Fruits and vegetables. Lean protein. Whole grains. Low-fat or nonfat dairy products. Managing pain and stiffness     Massage, stretch, and relax the muscle. Do this for a few minutes at a time. If told, put ice on the muscle. This may help if you are sore or have pain after a cramp or spasm. Put ice in a plastic bag. Place a towel between your skin and the bag. Leave the ice on for 20 minutes, 2-3 times a day. If told, put heat on tight or tense muscles. Do this as often as told by your doctor. Use the heat source that your doctor recommends, such as a moist heat pack or a heating pad. Place a towel between your skin and the heat source. Leave the heat on for 20-30 minutes. If your skin turns bright red, take off the ice or heat right away to prevent skin damage. The  risk of damage is higher if you cannot feel pain, heat, or cold. Take hot showers or baths to help relax the muscles. General instructions If you are having cramps often, avoid intense exercise for a few days. Take over-the-counter and prescription medicines only as told by your doctor. Watch for any changes in your symptoms. Contact a doctor if: Your cramps or spasms get worse or happen more often. Your cramps or spasms do not get better with time. This information is not intended to replace advice given to you by your health care provider. Make sure you discuss any questions you have with your health care provider. Document Revised: 09/20/2021 Document Reviewed: 09/20/2021 Elsevier Patient Education  2024 [employer].

## 2023-05-18 NOTE — Progress Notes (Signed)
 Acute Office Visit  Subjective:    Patient ID: TEISHA TROWBRIDGE, female    DOB: 1970/04/09, 53 y.o.   MRN: [id number]  Chief Complaint  Patient presents with   Leg Spasms    Spasm started in right leg/thigh at night could not move  recent Urgent Care visit     HPI Lumbar radiculopathy Acute on chronic, onset 2023, intermittent, worse with prolong sitting or supine position), describes as muscle spasms in alternating legs and tingling, occasionally radiates to lower leg. Symptoms is not triggered with exercise regimen (walking or weight training 3-4x/week) Denies any previous back injury or surgery. Some improvement in symptoms with baclofen, naproxen and IM dexamethasone.  Normal exam today. Advised to stop weight training exercise Maintain current meds Check lumbar spine for any disc herniation Entered referral to outpatient PT. Consider MRI lumbar spine if no improvement post PT completion  Wt Readings from Last 3 Encounters:  05/18/23 226 lb 3.2 oz (102.6 kg)  05/12/23 225 lb 12 oz (102.4 kg)  05/01/23 225 lb 12.8 oz (102.4 kg)    BP Readings from Last 3 Encounters:  05/18/23 136/76  05/12/23 124/79  05/01/23 130/84    Outpatient Medications Prior to Visit  Medication Sig   baclofen (LIORESAL) 10 MG tablet Take 1 tablet (10 mg total) by mouth 3 (three) times daily.   buPROPion (WELLBUTRIN XL) 150 MG 24 hr tablet Take 150 mg by mouth daily.   ergocalciferol (VITAMIN D2) 1.25 MG (50000 UT) capsule ergocalciferol (vitamin D2) 1,250 mcg (50,000 unit) capsule  TAKE 1 CAPSULE BY MOUTH ONCE A WEEK   linaclotide (LINZESS) 72 MCG capsule Take 72 mcg by mouth daily before breakfast.   naproxen (NAPROSYN) 500 MG tablet Take 1 tablet (500 mg total) by mouth 2 (two) times daily with a meal.   phentermine (ADIPEX-P) 37.5 MG tablet Take 37.5 mg by mouth daily.   tirzepatide (ZEPBOUND) 12.5 MG/0.5ML Pen Inject 12.5 mg into the skin once a week.   No facility-administered  medications prior to visit.    Reviewed past medical and social history.  Review of Systems Per HPI     Objective:    Physical Exam Vitals and nursing note reviewed.  Cardiovascular:     Rate and Rhythm: Normal rate.     Pulses: Normal pulses.  Pulmonary:     Effort: Pulmonary effort is normal.  Abdominal:     Tenderness: There is no right CVA tenderness or left CVA tenderness.  Musculoskeletal:     Thoracic back: Normal.     Lumbar back: Normal.     Right hip: Normal.     Left hip: Normal.     Right upper leg: Normal.     Left upper leg: Normal.     Right knee: Normal.     Left knee: Normal.  Neurological:     Mental Status: She is alert and oriented to person, place, and time.    BP 136/76 (BP Location: Right Arm, Patient Position: Sitting, Cuff Size: Large)   Pulse 70   Temp 98.4 F (36.9 C) (Temporal)   Ht 5\' 1"  (1.549 m)   Wt 226 lb 3.2 oz (102.6 kg)   LMP  (LMP Unknown)   SpO2 99%   BMI 42.74 kg/m    No results found for any visits on 05/18/23.     Assessment & Plan:   Problem List Items Addressed This Visit     Lumbar radiculopathy - Primary   Acute on  chronic, onset 2023, intermittent, worse with prolong sitting or supine position), describes as muscle spasms in alternating legs and tingling, occasionally radiates to lower leg. Symptoms is not triggered with exercise regimen (walking or weight training 3-4x/week) Denies any previous back injury or surgery. Some improvement in symptoms with baclofen, naproxen and IM dexamethasone.  Normal exam today. Advised to stop weight training exercise Maintain current meds Check lumbar spine for any disc herniation Entered referral to outpatient PT. Consider MRI lumbar spine if no improvement post PT completion      Relevant Orders   DG Lumbar Spine Complete   Ambulatory referral to Physical Therapy   No orders of the defined types were placed in this encounter.  Return in about 6 months (around  11/17/2023), or if symptoms worsen or fail to improve, for CPE (fasting).  Alysia Penna, NP

## 2023-05-18 NOTE — Assessment & Plan Note (Addendum)
 Acute on chronic, onset 2023, intermittent, worse with prolong sitting or supine position), describes as muscle spasms in alternating legs and tingling, occasionally radiates to lower leg. Symptoms is not triggered with exercise regimen (walking or weight training 3-4x/week) Denies any previous back injury or surgery. Some improvement in symptoms with baclofen, naproxen and IM dexamethasone.  Normal exam today. Advised to stop weight training exercise Maintain current meds Check lumbar spine for any disc herniation Entered referral to outpatient PT. Consider MRI lumbar spine if no improvement post PT completion

## 2023-05-30 ENCOUNTER — Ambulatory Visit (INDEPENDENT_AMBULATORY_CARE_PROVIDER_SITE_OTHER): Admitting: Physical Therapy

## 2023-05-30 DIAGNOSIS — M79604 Pain in right leg: Secondary | ICD-10-CM | POA: Diagnosis not present

## 2023-05-30 DIAGNOSIS — M6281 Muscle weakness (generalized): Secondary | ICD-10-CM | POA: Diagnosis not present

## 2023-05-30 NOTE — Patient Instructions (Signed)
 Access Code: [email or web address] URL: [email or web address] Date: 05/30/2023 Prepared by: Leah Primus  Exercises - Bridge  - 3-4 x weekly - 3 sets - 10 reps - Sidelying Hip Abduction  - 3-4 x weekly - 3 sets - 10 reps - Squat with Chair Touch  - 3-4 x weekly - 3 sets - 10 reps

## 2023-05-30 NOTE — Therapy (Signed)
 OUTPATIENT PHYSICAL THERAPY EVALUATION   Patient Name: Helen Miles MRN: [id number] DOB:Feb 12, 1971, 53 y.o., female Today's Date: 05/31/2023   END OF SESSION:  PT End of Session - 05/31/23 0803     Visit Number 1    Number of Visits 9    Date for PT Re-Evaluation 07/25/23    Authorization Type [employer]    PT Start Time 1516    PT Stop Time 1600    PT Time Calculation (min) 44 min    Activity Tolerance Patient tolerated treatment well    Behavior During Therapy WFL for tasks assessed/performed             Past Medical History:  Diagnosis Date   Fibroid, uterine    Generalized headaches    Obesity    Past Surgical History:  Procedure Laterality Date   CESAREAN SECTION     G 3 P 2 ( C section X2)   WISDOM TOOTH EXTRACTION     Patient Active Problem List   Diagnosis Date Noted   Lumbar radiculopathy 05/18/2023   Muscle spasm of right leg 05/01/2023   Vitamin D deficiency 01/03/2021   Shoulder pain, left 12/17/2020   Family history of colonic polyps 08/04/2019   Headache(784.0) 06/03/2009   TACHYCARDIA, HX OF 06/03/2009   Obesity, morbid (HCC) 11/12/2008   HIATAL HERNIA 10/23/2007   HYPERLIPIDEMIA 12/06/2006    PCP: Kandace Organ, NP  REFERRING PROVIDER: Kandace Organ, NP  REFERRING DIAG: Lumbar radiculopathy  Rationale for Evaluation and Treatment: Rehabilitation  THERAPY DIAG:  Pain in right leg  Muscle weakness (generalized)  ONSET DATE: Chronic, > 1 month   SUBJECTIVE:        SUBJECTIVE STATEMENT: Patient reports she is having muscle spasm or cramp in the front of the right thigh that just catches. A little over a month ago she had a spasm that woke up and was still sore a few days after. She does exercise but had not done any legs before that. She was given some medication for inflammation, and then went to urgent care where she got a steroid shot and muscle relaxer. States the medication helps but doesn't last long. She states  the muscle spasm hasn't occurred since that initial time and it is getting better, she has been walking on the treadmill and yesterday she did notice some pulling in the right lower back region. She has also noticed when she has to pick up her nephew or when she picks up something heavy she will have some difficulty. When she is lying on the left side it feels like the right leg is pulling down or feels heavy. Also when she reaches forward or out to the side she gets a little pain that goes down from the back to the leg.   PERTINENT HISTORY:  See PMH above  PAIN:  Are you having pain? Yes:  NPRS scale: 0/10 Pain location: Right anterior thigh, lower back Pain description: Cramp, spasm, pulling Aggravating factors: Walking, lifting the right leg into the car Relieving factors: Medication  PRECAUTIONS: None  RED FLAGS: None   WEIGHT BEARING RESTRICTIONS: No  FALLS:  Has patient fallen in last 6 months? No  PLOF: Independent  PATIENT GOALS: Pain relief to return back to gym exercises   OBJECTIVE:  Note: Objective measures were completed at Evaluation unless otherwise noted. DIAGNOSTIC FINDINGS:  Lumbar x-ray 05/18/2023 FINDINGS: There is no evidence of lumbar spine fracture. Alignment is normal. Grade 1 anterolisthesis of L4  on L5. Facet joint sclerosis noted on L4-5, L5-S1. Minimal anterior spurring noted in the lower thoracic and upper lumbar spine.  PATIENT SURVEYS:  PSFS: 4 Lifting my nephew: 4 Pushing a table: 4  COGNITION: Overall cognitive status: Within functional limits for tasks assessed    SENSATION: WFL  MUSCLE LENGTH: Grossly WFL, she does report increased tightness with ankle DF while assessing hamstring  POSTURE:   Increased lumbar lordosis  PALPATION: Tender to palpation right lateral mid-quad region  LUMBAR ROM:   AROM eval  Flexion WFL  Extension WFL  Right lateral flexion WFL  Left lateral flexion WFL  Right rotation WFL  Left rotation  WFL   (Blank rows = not tested)  Reports sensation in right thigh and medial when come back to neutral from extension and with right side bend  No direction preference reported  LOWER EXTREMITY ROM:      Hip and knee ROM grossly WFL  LOWER EXTREMITY MMT:    MMT Right eval Left eval  Hip flexion 4 4  Hip extension 3 3  Hip abduction 4- 4-  Hip adduction    Hip internal rotation    Hip external rotation    Knee flexion 5 5  Knee extension 5 5  Ankle dorsiflexion    Ankle plantarflexion    Ankle inversion    Ankle eversion     (Blank rows = not tested)  LUMBAR SPECIAL TESTS:  Radicular testing negative  FUNCTIONAL TESTS:  30 seconds chair stand test: 13 reps  GAIT: Assistive device utilized: None Level of assistance: Complete Independence Comments: WFL   TREATMENT [facility] Adult PT Treatment:                                                DATE: 05/30/2023 Bridge x 10 Side clamshell with red x 10 - patient reports pulling in right thigh Sidelying hip abduction x 10 Squat to table tap 2 x 10 Patient educated on possible etiologies of symptoms consisting of lumbar referral, persistent quad pain from previous spasm and muscle weakness and activity tolerance deficits contributing to pain. Patient also educated on gradual walking progression on treadmill and reintroducing incline, gradual return to gym based strengthening exercises using machines  PATIENT EDUCATION:  Education details: Exam findings, POC, HEP Person educated: Patient Education method: Explanation, Demonstration, Tactile cues, Verbal cues, and Handouts Education comprehension: verbalized understanding, returned demonstration, verbal cues required, tactile cues required, and needs further education  HOME EXERCISE PROGRAM: Access Code: [email or web address]    ASSESSMENT: CLINICAL IMPRESSION: Patient is a 53 y.o. female who was seen today for physical therapy evaluation and treatment for chronic right thigh pain from  previous spasm. Unclear exact etiology of symptoms, she does exhibit any clear radicular symptoms this visit but due to lack of any specific mechanism of injury her right leg pain could be a result of lumbar referral or just persistent pain from previous muscle spasm and then change in activity level contributing to continued symptoms. She exhibits overall good lumbar, hip, and knee motion without significant flexibility deficits. She does demonstrate gross strength deficit of her hips and core, and reports pulling in the right anterior thigh with testing and mobility. She was provided exercises to initiate strengthening for the core, hips, and LE, and educated on gradual return to activity.   OBJECTIVE IMPAIRMENTS: decreased activity tolerance,  decreased strength, postural dysfunction, and pain.   ACTIVITY LIMITATIONS: carrying, lifting, bending, sitting, standing, squatting, stairs, and locomotion level  PARTICIPATION LIMITATIONS: cleaning, shopping, community activity, and occupation  PERSONAL FACTORS: Fitness, Past/current experiences, and Time since onset of injury/illness/exacerbation are also affecting patient's functional outcome.   REHAB POTENTIAL: Good  CLINICAL DECISION MAKING: Stable/uncomplicated  EVALUATION COMPLEXITY: Low   GOALS: Goals reviewed with patient? Yes  SHORT TERM GOALS: Target date: 06/27/2023  Patient will be I with initial HEP in order to progress with therapy. Baseline: HEP provided at eval Goal status: INITIAL  2.  Patient will be able to walk 1 hour on treadmill at level incline without onset of pain in order to improve activity tolerance  Baseline: able to walk 45 minutes, feels pulling in right lower back and thigh Goal status: INITIAL  LONG TERM GOALS: Target date: 07/25/2023  Patient will be I with final HEP to maintain progress from PT. Baseline: HEP provided at eval Goal status: INITIAL  2.  Patient will report PSFS >/= 8 in order to indicate  improvement in her functional status Baseline: 4 Goal status: INITIAL  3.  Patient will demonstrate hip strength >/= 4/5 MMT in order to improve her activity tolerance Baseline: see limitations above Goal status: INITIAL  4.  Patient will be able to return to gym exercises a previously performed without pain or limitation Baseline: currently not performing gym strengthening exercises Goal status: INITIAL   PLAN: PT FREQUENCY: 1-2x/week  PT DURATION: 8 weeks  PLANNED INTERVENTIONS: 97164- PT Re-evaluation, 97110-Therapeutic exercises, 97530- Therapeutic activity, 97112- Neuromuscular re-education, 97535- Self Care, 40981- Manual therapy, Patient/Family education, Balance training, Taping, Dry Needling, Joint mobilization, Joint manipulation, Spinal manipulation, Spinal mobilization, Cryotherapy, and Moist heat.  PLAN FOR NEXT SESSION: Review HEP and progress PRN, continue progression of core stabilization and hip/LE strengthening, further lumbar and neurodynamic assessment as indicated   Leah Primus, PT, DPT, LAT, ATC 05/31/23  11:12 AM Phone: [phone] Fax: [phone]

## 2023-05-31 ENCOUNTER — Other Ambulatory Visit: Payer: Self-pay

## 2023-05-31 ENCOUNTER — Encounter: Payer: Self-pay | Admitting: Physical Therapy

## 2023-06-06 ENCOUNTER — Encounter: Admitting: Physical Therapy

## 2023-06-11 ENCOUNTER — Ambulatory Visit (INDEPENDENT_AMBULATORY_CARE_PROVIDER_SITE_OTHER): Admitting: Physical Therapy

## 2023-06-11 ENCOUNTER — Encounter: Payer: Self-pay | Admitting: Physical Therapy

## 2023-06-11 ENCOUNTER — Encounter: Payer: Self-pay | Admitting: Gastroenterology

## 2023-06-11 ENCOUNTER — Other Ambulatory Visit: Payer: Self-pay

## 2023-06-11 DIAGNOSIS — M79604 Pain in right leg: Secondary | ICD-10-CM | POA: Diagnosis not present

## 2023-06-11 DIAGNOSIS — M6281 Muscle weakness (generalized): Secondary | ICD-10-CM | POA: Diagnosis not present

## 2023-06-11 NOTE — Therapy (Signed)
 OUTPATIENT PHYSICAL THERAPY TREATMENT   Patient Name: Helen Miles MRN: [id number] DOB:Nov 30, 1970, 53 y.o., female Today's Date: 06/11/2023   END OF SESSION:  PT End of Session - 06/11/23 1350     Visit Number 2    Number of Visits 9    Date for PT Re-Evaluation 07/25/23    Authorization Type [employer]    PT Start Time 1348    PT Stop Time 1430    PT Time Calculation (min) 42 min    Activity Tolerance Patient tolerated treatment well    Behavior During Therapy WFL for tasks assessed/performed              Past Medical History:  Diagnosis Date   Fibroid, uterine    Generalized headaches    Obesity    Past Surgical History:  Procedure Laterality Date   CESAREAN SECTION     G 3 P 2 ( C section X2)   WISDOM TOOTH EXTRACTION     Patient Active Problem List   Diagnosis Date Noted   Lumbar radiculopathy 05/18/2023   Muscle spasm of right leg 05/01/2023   Vitamin D deficiency 01/03/2021   Shoulder pain, left 12/17/2020   Family history of colonic polyps 08/04/2019   Headache(784.0) 06/03/2009   TACHYCARDIA, HX OF 06/03/2009   Obesity, morbid (HCC) 11/12/2008   HIATAL HERNIA 10/23/2007   HYPERLIPIDEMIA 12/06/2006    PCP: Kandace Organ, NP  REFERRING PROVIDER: Kandace Organ, NP  REFERRING DIAG: Lumbar radiculopathy  Rationale for Evaluation and Treatment: Rehabilitation  THERAPY DIAG:  Pain in right leg  Muscle weakness (generalized)  ONSET DATE: Chronic, > 1 month   SUBJECTIVE:        SUBJECTIVE STATEMENT: Patient reports she has been to the gym several times and did some spring cleaning this weekend without any issues. She was able to do most of the machines and walked on the treadmill for 30 minutes with an incline of about 5 after halfway through.   Eval: Patient reports she is having muscle spasm or cramp in the front of the right thigh that just catches. A little over a month ago she had a spasm that woke up and was still sore a  few days after. She does exercise but had not done any legs before that. She was given some medication for inflammation, and then went to urgent care where she got a steroid shot and muscle relaxer. States the medication helps but doesn't last long. She states the muscle spasm hasn't occurred since that initial time and it is getting better, she has been walking on the treadmill and yesterday she did notice some pulling in the right lower back region. She has also noticed when she has to pick up her nephew or when she picks up something heavy she will have some difficulty. When she is lying on the left side it feels like the right leg is pulling down or feels heavy. Also when she reaches forward or out to the side she gets a little pain that goes down from the back to the leg.   PERTINENT HISTORY:  See PMH above  PAIN:  Are you having pain? Yes:  NPRS scale: 0/10 Pain location: Right anterior thigh, lower back Pain description: Cramp, spasm, pulling Aggravating factors: Walking, lifting the right leg into the car Relieving factors: Medication  PRECAUTIONS: None  PATIENT GOALS: Pain relief to return back to gym exercises   OBJECTIVE:  Note: Objective measures were completed at Evaluation  unless otherwise noted. DIAGNOSTIC FINDINGS:  Lumbar x-ray 05/18/2023 FINDINGS: There is no evidence of lumbar spine fracture. Alignment is normal. Grade 1 anterolisthesis of L4 on L5. Facet joint sclerosis noted on L4-5, L5-S1. Minimal anterior spurring noted in the lower thoracic and upper lumbar spine.  PATIENT SURVEYS:  PSFS: 4 Lifting my nephew: 4 Pushing a table: 4  MUSCLE LENGTH: Grossly WFL, she does report increased tightness with ankle DF while assessing hamstring  POSTURE:   Increased lumbar lordosis  PALPATION: Tender to palpation right lateral mid-quad region  LUMBAR ROM:   AROM eval  Flexion WFL  Extension WFL  Right lateral flexion WFL  Left lateral flexion WFL  Right  rotation WFL  Left rotation WFL   (Blank rows = not tested)  Reports sensation in right thigh and medial when come back to neutral from extension and with right side bend  No direction preference reported  LOWER EXTREMITY MMT:    MMT Right eval Left eval  Hip flexion 4 4  Hip extension 3 3  Hip abduction 4- 4-  Hip adduction    Hip internal rotation    Hip external rotation    Knee flexion 5 5  Knee extension 5 5  Ankle dorsiflexion    Ankle plantarflexion    Ankle inversion    Ankle eversion     (Blank rows = not tested)  LUMBAR SPECIAL TESTS:  Radicular testing negative  FUNCTIONAL TESTS:  30 seconds chair stand test: 13 reps  GAIT: Assistive device utilized: None Level of assistance: Complete Independence Comments: WFL   TREATMENT [facility] Adult PT Treatment:                                                DATE: 06/11/2023 Recumbent bike L3 x 5 min to improve endurance and workload capacity STM to right quad using FR and theragun in modified thomas position Modified thomas stretch 2 x 20 sec on right SLR 2 x 10 each Bridge 2 x 10 Side clamshell with red 2 x 10 Sidelying hip abduction 2 x 10 LAQ with 3# 3 x 10 Squat to table tap 3 x 10 Forward 6" alternating runner step-up x 20, 8" x 20  PATIENT EDUCATION:  Education details: HEP update Person educated: Patient Education method: Explanation, Demonstration, Tactile cues, Verbal cues, and Handouts Education comprehension: verbalized understanding, returned demonstration, verbal cues required, tactile cues required, and needs further education  HOME EXERCISE PROGRAM: Access Code: [email or web address]    ASSESSMENT: CLINICAL IMPRESSION: Patient tolerated therapy well with no adverse effects. She arrives reporting improvement in her symptoms and she has returned to going to the gym. Therapy focused on reducing muscle tension of the right quad and progressing her LE strengthening with good tolerance. She does report  occasional pulling of the right quad with exercises but this resolved when she completes the exercise and did not persist. She was able to progress with weight bearing exercises and updated her HEP to progress quad strengthening. Patient would benefit from continued skilled PT to progress mobility and strength in order to reduce pain and maximize functional ability.   Eval: Patient is a 53 y.o. female who was seen today for physical therapy evaluation and treatment for chronic right thigh pain from previous spasm. Unclear exact etiology of symptoms, she does exhibit any clear radicular symptoms this visit but  due to lack of any specific mechanism of injury her right leg pain could be a result of lumbar referral or just persistent pain from previous muscle spasm and then change in activity level contributing to continued symptoms. She exhibits overall good lumbar, hip, and knee motion without significant flexibility deficits. She does demonstrate gross strength deficit of her hips and core, and reports pulling in the right anterior thigh with testing and mobility. She was provided exercises to initiate strengthening for the core, hips, and LE, and educated on gradual return to activity.   OBJECTIVE IMPAIRMENTS: decreased activity tolerance, decreased strength, postural dysfunction, and pain.   ACTIVITY LIMITATIONS: carrying, lifting, bending, sitting, standing, squatting, stairs, and locomotion level  PARTICIPATION LIMITATIONS: cleaning, shopping, community activity, and occupation  PERSONAL FACTORS: Fitness, Past/current experiences, and Time since onset of injury/illness/exacerbation are also affecting patient's functional outcome.    GOALS: Goals reviewed with patient? Yes  SHORT TERM GOALS: Target date: 06/27/2023  Patient will be I with initial HEP in order to progress with therapy. Baseline: HEP provided at eval Goal status: INITIAL  2.  Patient will be able to walk 1 hour on treadmill at  level incline without onset of pain in order to improve activity tolerance  Baseline: able to walk 45 minutes, feels pulling in right lower back and thigh Goal status: INITIAL  LONG TERM GOALS: Target date: 07/25/2023  Patient will be I with final HEP to maintain progress from PT. Baseline: HEP provided at eval Goal status: INITIAL  2.  Patient will report PSFS >/= 8 in order to indicate improvement in her functional status Baseline: 4 Goal status: INITIAL  3.  Patient will demonstrate hip strength >/= 4/5 MMT in order to improve her activity tolerance Baseline: see limitations above Goal status: INITIAL  4.  Patient will be able to return to gym exercises a previously performed without pain or limitation Baseline: currently not performing gym strengthening exercises Goal status: INITIAL   PLAN: PT FREQUENCY: 1-2x/week  PT DURATION: 8 weeks  PLANNED INTERVENTIONS: 97164- PT Re-evaluation, 97110-Therapeutic exercises, 97530- Therapeutic activity, 97112- Neuromuscular re-education, 97535- Self Care, 16109- Manual therapy, Patient/Family education, Balance training, Taping, Dry Needling, Joint mobilization, Joint manipulation, Spinal manipulation, Spinal mobilization, Cryotherapy, and Moist heat.  PLAN FOR NEXT SESSION: Review HEP and progress PRN, continue progression of core stabilization and hip/LE strengthening, further lumbar and neurodynamic assessment as indicated   Leah Primus, PT, DPT, LAT, ATC 06/11/23  2:35 PM Phone: [phone] Fax: [phone]

## 2023-06-11 NOTE — Patient Instructions (Signed)
 Access Code: [email or web address] URL: [email or web address] Date: 06/11/2023 Prepared by: Leah Primus  Exercises - Active Straight Leg Raise with Quad Set  - 3-4 x weekly - 3 sets - 10 reps - Bridge  - 3-4 x weekly - 3 sets - 10 reps - Sidelying Hip Abduction  - 3-4 x weekly - 3 sets - 10 reps - Squat with Chair Touch  - 3-4 x weekly - 3 sets - 10 reps

## 2023-06-12 ENCOUNTER — Ambulatory Visit: Admitting: Gastroenterology

## 2023-06-13 ENCOUNTER — Encounter: Admitting: Physical Therapy

## 2023-06-20 ENCOUNTER — Ambulatory Visit (INDEPENDENT_AMBULATORY_CARE_PROVIDER_SITE_OTHER): Admitting: Physical Therapy

## 2023-06-20 ENCOUNTER — Other Ambulatory Visit: Payer: Self-pay

## 2023-06-20 ENCOUNTER — Encounter: Payer: Self-pay | Admitting: Physical Therapy

## 2023-06-20 DIAGNOSIS — M79604 Pain in right leg: Secondary | ICD-10-CM

## 2023-06-20 DIAGNOSIS — M6281 Muscle weakness (generalized): Secondary | ICD-10-CM | POA: Diagnosis not present

## 2023-06-20 NOTE — Therapy (Signed)
 OUTPATIENT PHYSICAL THERAPY TREATMENT   Patient Name: Helen Miles MRN: [id number] DOB:1971-01-20, 53 y.o., female Today's Date: 06/20/2023   END OF SESSION:  PT End of Session - 06/20/23 0823     Visit Number 3    Number of Visits 9    Date for PT Re-Evaluation 07/25/23    Authorization Type [employer]    PT Start Time 0818    PT Stop Time 0845    PT Time Calculation (min) 27 min    Activity Tolerance Patient tolerated treatment well    Behavior During Therapy WFL for tasks assessed/performed               Past Medical History:  Diagnosis Date   Fibroid, uterine    Generalized headaches    Obesity    Past Surgical History:  Procedure Laterality Date   CESAREAN SECTION     G 3 P 2 ( C section X2)   WISDOM TOOTH EXTRACTION     Patient Active Problem List   Diagnosis Date Noted   Lumbar radiculopathy 05/18/2023   Muscle spasm of right leg 05/01/2023   Vitamin D deficiency 01/03/2021   Shoulder pain, left 12/17/2020   Family history of colonic polyps 08/04/2019   Headache(784.0) 06/03/2009   TACHYCARDIA, HX OF 06/03/2009   Obesity, morbid (HCC) 11/12/2008   HIATAL HERNIA 10/23/2007   HYPERLIPIDEMIA 12/06/2006    PCP: Kandace Organ, NP  REFERRING PROVIDER: Kandace Organ, NP  REFERRING DIAG: Lumbar radiculopathy  Rationale for Evaluation and Treatment: Rehabilitation  THERAPY DIAG:  Pain in right leg  Muscle weakness (generalized)  ONSET DATE: Chronic, > 1 month   SUBJECTIVE:        SUBJECTIVE STATEMENT: Patient reports she is still having the same type of pressure and tensing up of her right thigh. She states the tensing up will happen with activity, if she is doing a lot of walking. She also noticed her right knee was aching at night.   Eval: Patient reports she is having muscle spasm or cramp in the front of the right thigh that just catches. A little over a month ago she had a spasm that woke up and was still sore a few days  after. She does exercise but had not done any legs before that. She was given some medication for inflammation, and then went to urgent care where she got a steroid shot and muscle relaxer. States the medication helps but doesn't last long. She states the muscle spasm hasn't occurred since that initial time and it is getting better, she has been walking on the treadmill and yesterday she did notice some pulling in the right lower back region. She has also noticed when she has to pick up her nephew or when she picks up something heavy she will have some difficulty. When she is lying on the left side it feels like the right leg is pulling down or feels heavy. Also when she reaches forward or out to the side she gets a little pain that goes down from the back to the leg.   PERTINENT HISTORY:  See PMH above  PAIN:  Are you having pain? Yes:  NPRS scale: 2/10 Pain location: Right anterior thigh Pain description: Tense Aggravating factors: Walking, lifting the right leg into the car Relieving factors: Medication  PRECAUTIONS: None  PATIENT GOALS: Pain relief to return back to gym exercises   OBJECTIVE:  Note: Objective measures were completed at Evaluation unless otherwise noted. DIAGNOSTIC  FINDINGS:  Lumbar x-ray 05/18/2023 FINDINGS: There is no evidence of lumbar spine fracture. Alignment is normal. Grade 1 anterolisthesis of L4 on L5. Facet joint sclerosis noted on L4-5, L5-S1. Minimal anterior spurring noted in the lower thoracic and upper lumbar spine.  PATIENT SURVEYS:  PSFS: 4 Lifting my nephew: 4 Pushing a table: 4  MUSCLE LENGTH: Grossly WFL, she does report increased tightness with ankle DF while assessing hamstring  POSTURE:   Increased lumbar lordosis  PALPATION: Tender to palpation right lateral mid-quad region  LUMBAR ROM:   AROM eval  Flexion WFL  Extension WFL  Right lateral flexion WFL  Left lateral flexion WFL  Right rotation WFL  Left rotation WFL    (Blank rows = not tested)  Reports sensation in right thigh and medial when come back to neutral from extension and with right side bend  No direction preference reported  LOWER EXTREMITY MMT:    MMT Right eval Left eval  Hip flexion 4 4  Hip extension 3 3  Hip abduction 4- 4-  Hip adduction    Hip internal rotation    Hip external rotation    Knee flexion 5 5  Knee extension 5 5  Ankle dorsiflexion    Ankle plantarflexion    Ankle inversion    Ankle eversion     (Blank rows = not tested)  LUMBAR SPECIAL TESTS:  Radicular testing negative  FUNCTIONAL TESTS:  30 seconds chair stand test: 13 reps  GAIT: Assistive device utilized: None Level of assistance: Complete Independence Comments: WFL   TREATMENT [facility] Adult PT Treatment:                                                DATE: 06/20/2023 Recumbent bike L4 x 3 min to improve endurance and workload capacity STM to right quad theragun in modified thomas position Modified thomas stretch 3 x 20 sec on right ART for right quad in thomas stretch position Standing TKE with L2 power band 2 x 10 Squat to table tap holding 15# at chest 2 x 10 Lateral band walk with green at knees x 1 lap, at ankles x 1 lap  PATIENT EDUCATION:  Education details: HEP Person educated: Patient Education method: Programmer, multimedia, Demonstration, Actor cues, Verbal cues Education comprehension: verbalized understanding, returned demonstration, verbal cues required, tactile cues required, and needs further education  HOME EXERCISE PROGRAM: Access Code: [email or web address]    ASSESSMENT: CLINICAL IMPRESSION: Patient tolerated therapy well with no adverse effects. Therapy limited due to patient arriving late. Therapy focused on reducing muscle tension and stretching for the right quad, and progressing her right quad and LE strengthening. Se did not report any increase in the right quad discomfort but did note some right sided back muscular pain when performing  squats so elevated the table with better tolerance. She does exhibit increased dynamic knee valgus on the right with her squats so incorporated hip strengthening. No changes to HEP this visit but she was encouraged to return to gym routine and walking as tolerated. Patient would benefit from continued skilled PT to progress mobility and strength in order to reduce pain and maximize functional ability.   Eval: Patient is a 53 y.o. female who was seen today for physical therapy evaluation and treatment for chronic right thigh pain from previous spasm. Unclear exact etiology of symptoms, she does exhibit  any clear radicular symptoms this visit but due to lack of any specific mechanism of injury her right leg pain could be a result of lumbar referral or just persistent pain from previous muscle spasm and then change in activity level contributing to continued symptoms. She exhibits overall good lumbar, hip, and knee motion without significant flexibility deficits. She does demonstrate gross strength deficit of her hips and core, and reports pulling in the right anterior thigh with testing and mobility. She was provided exercises to initiate strengthening for the core, hips, and LE, and educated on gradual return to activity.   OBJECTIVE IMPAIRMENTS: decreased activity tolerance, decreased strength, postural dysfunction, and pain.   ACTIVITY LIMITATIONS: carrying, lifting, bending, sitting, standing, squatting, stairs, and locomotion level  PARTICIPATION LIMITATIONS: cleaning, shopping, community activity, and occupation  PERSONAL FACTORS: Fitness, Past/current experiences, and Time since onset of injury/illness/exacerbation are also affecting patient's functional outcome.    GOALS: Goals reviewed with patient? Yes  SHORT TERM GOALS: Target date: 06/27/2023  Patient will be I with initial HEP in order to progress with therapy. Baseline: HEP provided at eval Goal status: INITIAL  2.  Patient will be  able to walk 1 hour on treadmill at level incline without onset of pain in order to improve activity tolerance  Baseline: able to walk 45 minutes, feels pulling in right lower back and thigh Goal status: INITIAL  LONG TERM GOALS: Target date: 07/25/2023  Patient will be I with final HEP to maintain progress from PT. Baseline: HEP provided at eval Goal status: INITIAL  2.  Patient will report PSFS >/= 8 in order to indicate improvement in her functional status Baseline: 4 Goal status: INITIAL  3.  Patient will demonstrate hip strength >/= 4/5 MMT in order to improve her activity tolerance Baseline: see limitations above Goal status: INITIAL  4.  Patient will be able to return to gym exercises a previously performed without pain or limitation Baseline: currently not performing gym strengthening exercises Goal status: INITIAL   PLAN: PT FREQUENCY: 1-2x/week  PT DURATION: 8 weeks  PLANNED INTERVENTIONS: 97164- PT Re-evaluation, 97110-Therapeutic exercises, 97530- Therapeutic activity, 97112- Neuromuscular re-education, 97535- Self Care, 16109- Manual therapy, Patient/Family education, Balance training, Taping, Dry Needling, Joint mobilization, Joint manipulation, Spinal manipulation, Spinal mobilization, Cryotherapy, and Moist heat.  PLAN FOR NEXT SESSION: Review HEP and progress PRN, continue progression of core stabilization and hip/LE strengthening, further lumbar and neurodynamic assessment as indicated   Leah Primus, PT, DPT, LAT, ATC 06/20/23  9:14 AM Phone: [phone] Fax: [phone]

## 2023-07-10 ENCOUNTER — Encounter: Admitting: Physical Therapy

## 2023-07-24 ENCOUNTER — Encounter: Admitting: Physical Therapy

## 2023-07-30 ENCOUNTER — Ambulatory Visit (INDEPENDENT_AMBULATORY_CARE_PROVIDER_SITE_OTHER): Admitting: Physical Therapy

## 2023-07-30 ENCOUNTER — Other Ambulatory Visit: Payer: Self-pay

## 2023-07-30 ENCOUNTER — Encounter: Payer: Self-pay | Admitting: Physical Therapy

## 2023-07-30 DIAGNOSIS — M79604 Pain in right leg: Secondary | ICD-10-CM

## 2023-07-30 DIAGNOSIS — M6281 Muscle weakness (generalized): Secondary | ICD-10-CM

## 2023-07-30 NOTE — Patient Instructions (Signed)
 Access Code: [email or web address] URL: [email or web address] Date: 07/30/2023 Prepared by: Leah Primus  Exercises - Side Stepping with Resistance at Ankles  - 1 x daily - 3 sets - 20 reps

## 2023-07-30 NOTE — Therapy (Addendum)
 OUTPATIENT PHYSICAL THERAPY TREATMENT  DISCHARGE   Patient Name: Helen Miles MRN: [id number] DOB:1970/03/08, 53 y.o., female Today's Date: 07/31/2023   END OF SESSION:  PT End of Session - 07/30/23 1628     Visit Number 4    Number of Visits 10    Date for PT Re-Evaluation 09/10/23    Authorization Type [employer]    PT Start Time 1601    PT Stop Time 1640    PT Time Calculation (min) 39 min    Activity Tolerance Patient tolerated treatment well    Behavior During Therapy WFL for tasks assessed/performed             Past Medical History:  Diagnosis Date   Fibroid, uterine    Generalized headaches    Obesity    Past Surgical History:  Procedure Laterality Date   CESAREAN SECTION     G 3 P 2 ( C section X2)   WISDOM TOOTH EXTRACTION     Patient Active Problem List   Diagnosis Date Noted   Lumbar radiculopathy 05/18/2023   Muscle spasm of right leg 05/01/2023   Vitamin D deficiency 01/03/2021   Shoulder pain, left 12/17/2020   Family history of colonic polyps 08/04/2019   Headache(784.0) 06/03/2009   TACHYCARDIA, HX OF 06/03/2009   Obesity, morbid (HCC) 11/12/2008   HIATAL HERNIA 10/23/2007   HYPERLIPIDEMIA 12/06/2006    PCP: Katheen Roselie Rockford, NP  REFERRING PROVIDER: Katheen Roselie Rockford, NP  REFERRING DIAG: Lumbar radiculopathy  Rationale for Evaluation and Treatment: Rehabilitation  THERAPY DIAG:  Pain in right leg  Muscle weakness (generalized)  ONSET DATE: Chronic, > 1 month   SUBJECTIVE:        SUBJECTIVE STATEMENT: Patient reports she is getting back to the gym for 3-5 days and has not had any issues with the right leg. She is still getting back to her walking on the treadmill like she used to.  Eval: Patient reports she is having muscle spasm or cramp in the front of the right thigh that just catches. A little over a month ago she had a spasm that woke up and was still sore a few days after. She does exercise but had not done any  legs before that. She was given some medication for inflammation, and then went to urgent care where she got a steroid shot and muscle relaxer. States the medication helps but doesn't last long. She states the muscle spasm hasn't occurred since that initial time and it is getting better, she has been walking on the treadmill and yesterday she did notice some pulling in the right lower back region. She has also noticed when she has to pick up her nephew or when she picks up something heavy she will have some difficulty. When she is lying on the left side it feels like the right leg is pulling down or feels heavy. Also when she reaches forward or out to the side she gets a little pain that goes down from the back to the leg.   PERTINENT HISTORY:  See PMH above  PAIN:  Are you having pain? Yes:  NPRS scale: 0/10 Pain location: Right anterior thigh Pain description: Tense Aggravating factors: Walking, lifting the right leg into the car Relieving factors: Medication  PRECAUTIONS: None  PATIENT GOALS: Pain relief to return back to gym exercises   OBJECTIVE:  Note: Objective measures were completed at Evaluation unless otherwise noted. DIAGNOSTIC FINDINGS:  Lumbar x-ray 05/18/2023 FINDINGS: There is no  evidence of lumbar spine fracture. Alignment is normal. Grade 1 anterolisthesis of L4 on L5. Facet joint sclerosis noted on L4-5, L5-S1. Minimal anterior spurring noted in the lower thoracic and upper lumbar spine.  PATIENT SURVEYS:  PSFS: 4 Lifting my nephew: 4 Pushing a table: 4  07/30/2022: PSFS: 9 Lifting my nephew: 8 Pushing a table: 10  MUSCLE LENGTH: Grossly WFL, she does report increased tightness with ankle DF while assessing hamstring  POSTURE:   Increased lumbar lordosis  PALPATION: Tender to palpation right lateral mid-quad region  LUMBAR ROM:   AROM eval 07/30/2023  Flexion WFL WFL  Extension [facility] WFL  Right lateral flexion [facility] WFL  Left lateral flexion WFL WFL   Right rotation [facility] WFL  Left rotation WFL WFL   (Blank rows = not tested)  Reports sensation in right thigh and medial when come back to neutral from extension and with right side bend  No direction preference reported  LOWER EXTREMITY MMT:    MMT Right eval Left eval Rt / Lt 07/30/2023  Hip flexion 4 4   Hip extension 3 3   Hip abduction 4- 4- 4- / 4-  Hip adduction     Hip internal rotation     Hip external rotation     Knee flexion 5 5   Knee extension 5 5   Ankle dorsiflexion     Ankle plantarflexion     Ankle inversion     Ankle eversion      (Blank rows = not tested)  LUMBAR SPECIAL TESTS:  Radicular testing negative  FUNCTIONAL TESTS:  30 seconds chair stand test: 13 reps  07/30/2023: 14 reps  GAIT: Assistive device utilized: None Level of assistance: Complete Independence Comments: WFL   TREATMENT [facility] Adult PT Treatment:                                                DATE: 07/30/2023 Lateral band walk with blue at ankles 3 x 20 down/back Deadlift with 20# 3 x 10 Squat to table tap holding 15# at chest 3 x 10 Forward alternating 8 runner step-up holding 8# bilat 2 x 20  Discussed continued progression back to gym exercises and walking  PATIENT EDUCATION:  Education details: HEP update Person educated: Patient Education method: Explanation, Demonstration, Tactile cues, Verbal cues, Handout Education comprehension: verbalized understanding, returned demonstration, verbal cues required, tactile cues required, and needs further education  HOME EXERCISE PROGRAM: Access Code: [email or web address]    ASSESSMENT: CLINICAL IMPRESSION: Patient tolerated therapy well with no adverse effects. Therapy focused on progressing hip and LE strengthening with good tolerance. She reports great improvement in her functional status and has nearly returned to gym at prior level. She does continue to exhibit limitations with her hip strength. Progressed with strengthening exercises  this visit and updated her HEP to progress hip strengthening. Patient would benefit from continued skilled PT to progress mobility and strength in order to reduce pain and maximize functional ability.   Eval: Patient is a 53 y.o. female who was seen today for physical therapy evaluation and treatment for chronic right thigh pain from previous spasm. Unclear exact etiology of symptoms, she does exhibit any clear radicular symptoms this visit but due to lack of any specific mechanism of injury her right leg pain could be a result of lumbar referral or just persistent pain  from previous muscle spasm and then change in activity level contributing to continued symptoms. She exhibits overall good lumbar, hip, and knee motion without significant flexibility deficits. She does demonstrate gross strength deficit of her hips and core, and reports pulling in the right anterior thigh with testing and mobility. She was provided exercises to initiate strengthening for the core, hips, and LE, and educated on gradual return to activity.   OBJECTIVE IMPAIRMENTS: decreased activity tolerance, decreased strength, postural dysfunction, and pain.   ACTIVITY LIMITATIONS: carrying, lifting, bending, sitting, standing, squatting, stairs, and locomotion level  PARTICIPATION LIMITATIONS: cleaning, shopping, community activity, and occupation  PERSONAL FACTORS: Fitness, Past/current experiences, and Time since onset of injury/illness/exacerbation are also affecting patient's functional outcome.    GOALS: Goals reviewed with patient? Yes  SHORT TERM GOALS: Target date: 08/20/2023  Patient will be I with initial HEP in order to progress with therapy. Baseline: HEP provided at eval 07/30/2023: independent Goal status: MET  2.  Patient will be able to walk 1 hour on treadmill at level incline without onset of pain in order to improve activity tolerance  Baseline: able to walk 45 minutes, feels pulling in right lower back and  thigh 07/30/2023: doing 30 minutes with no issues Goal status: ONGOING  LONG TERM GOALS: Target date: 09/10/2023  Patient will be I with final HEP to maintain progress from PT. Baseline: HEP provided at eval 07/30/2023: progressing Goal status: ONGOING  2.  Patient will report PSFS >/= 8 in order to indicate improvement in her functional status Baseline: 4 07/30/2023: 9 Goal status: MET  3.  Patient will demonstrate hip strength >/= 4/5 MMT in order to improve her activity tolerance Baseline: see limitations above 07/30/2023: see limitations above Goal status: ONGOING  4.  Patient will be able to return to gym exercises a previously performed without pain or limitation Baseline: currently not performing gym strengthening exercises 07/30/2023: continuing to progress Goal status: ONGOING   PLAN: PT FREQUENCY: 1-2x/week  PT DURATION: 8 weeks  PLANNED INTERVENTIONS: 97164- PT Re-evaluation, 97110-Therapeutic exercises, 97530- Therapeutic activity, 97112- Neuromuscular re-education, 97535- Self Care, [id number]- Manual therapy, Patient/Family education, Balance training, Taping, Dry Needling, Joint mobilization, Joint manipulation, Spinal manipulation, Spinal mobilization, Cryotherapy, and Moist heat.  PLAN FOR NEXT SESSION: Review HEP and progress PRN, continue progression of core stabilization and hip/LE strengthening, further lumbar and neurodynamic assessment as indicated   Elaine Daring, PT, DPT, LAT, ATC 07/31/23  8:02 AM Phone: [phone] Fax: [phone]   PHYSICAL THERAPY DISCHARGE SUMMARY  Visits from Start of Care: 4  Current functional level related to goals / functional outcomes: See aboves   Remaining deficits: see above   Education / Equipment: HEP   Patient agrees to discharge. Patient goals were partially met. Patient is being discharged due to not returning since the last visit.  Elaine Daring, PT, DPT, LAT, ATC 12/05/23  12:39 PM Phone:  [phone] Fax: [phone]

## 2023-07-31 ENCOUNTER — Encounter: Admitting: Physical Therapy

## 2023-08-27 ENCOUNTER — Telehealth: Payer: Self-pay

## 2023-08-27 NOTE — Telephone Encounter (Signed)
 Copied from CRM [id number]. Topic: Clinical - Request for Lab/Test Order >> Aug 27, 2023 10:05 AM Chasity T wrote: Reason for CRM: Patient is requesting if any lab work that needs to be done could she get it done before her appointment so the results can be ready at the time of the appointment.

## 2023-08-28 NOTE — Telephone Encounter (Signed)
 Called and left a detailed voice message for patient per DPR on file stating the comment from [facility] and asked if she has any questions to please give me a call back at the office at [phone].

## 2023-09-03 ENCOUNTER — Encounter: Admitting: Physical Therapy

## 2023-09-11 LAB — LAB REPORT - SCANNED
EGFR: 65
Free T4: 1.13 ng/dL
TSH: 1.54 (ref 0.41–5.90)

## 2023-10-09 ENCOUNTER — Ambulatory Visit (INDEPENDENT_AMBULATORY_CARE_PROVIDER_SITE_OTHER): Admitting: Nurse Practitioner

## 2023-10-09 VITALS — BP 136/78 | HR 60 | Temp 97.3°F | Ht 61.0 in | Wt 219.4 lb

## 2023-10-09 DIAGNOSIS — Z0001 Encounter for general adult medical examination with abnormal findings: Secondary | ICD-10-CM

## 2023-10-09 DIAGNOSIS — E782 Mixed hyperlipidemia: Secondary | ICD-10-CM

## 2023-10-09 NOTE — Assessment & Plan Note (Signed)
 Reviewed labs completed by [facility] on 08/2023: TC 208 and LDL at 117, normal trig and HDL. No hx of DIABETES or CAD or PAD Encouraged to maintain a mediterranean diet

## 2023-10-09 NOTE — Patient Instructions (Addendum)
 Use myfitness pal Maintain Heart healthy diet and daily exercise. Sign medical release to get recent ECG and echocardiogram completed by [facility]  Mediterranean Diet A Mediterranean diet is based on the traditions of countries on the [employer]. It focuses on eating more: Fruits and vegetables. Whole grains, beans, nuts, and seeds. Heart-healthy fats. These are fats that are good for your heart. It involves eating less: Dairy. Meat and eggs. Processed foods with added sugar, salt, and fat. This type of diet can help prevent certain conditions. It can also improve outcomes if you have a long-term (chronic) disease, such as kidney or heart disease. What are tips for following this plan? Reading food labels Check packaged foods for: The serving size. For foods such as rice and pasta, the serving size is the amount of cooked product, not dry. The total fat. Avoid foods with saturated fat or trans fat. Added sugars, such as corn syrup. Shopping  Try to have a balanced diet. Buy a variety of foods, such as: Fresh fruits and vegetables. You may be able to get these from local farmers markets. You can also buy them frozen. Grains, beans, nuts, and seeds. Some of these can be bought in bulk. Fresh seafood. Poultry and eggs. Low-fat dairy products. Buy whole ingredients instead of foods that have already been packaged. If you can't get fresh seafood, buy precooked frozen shrimp or canned fish, such as tuna, salmon, or sardines. Stock your pantry so you always have certain foods on hand, such as olive oil, canned tuna, canned tomatoes, rice, pasta, and beans. Cooking Cook foods with extra-virgin olive oil instead of using butter or other vegetable oils. Have meat as a side dish. Have vegetables or grains as your main dish. This means having meat in small portions or adding small amounts of meat to foods like pasta or stew. Use beans or vegetables instead of meat in common dishes  like chili or lasagna. Try out different cooking methods. Try roasting, broiling, steaming, and sauting vegetables. Add frozen vegetables to soups, stews, pasta, or rice. Add nuts or seeds for added healthy fats and plant protein at each meal. You can add these to yogurt, salads, or vegetable dishes. Marinate fish or vegetables using olive oil, lemon juice, garlic, and fresh herbs. Meal planning Plan to eat a vegetarian meal one day each week. Try to work up to two vegetarian meals, if possible. Eat seafood two or more times a week. Have healthy snacks on hand. These may include: Vegetable sticks with hummus. Greek yogurt. Fruit and nut trail mix. Eat balanced meals. These should include: Fruit: 2-3 servings a day. Vegetables: 4-5 servings a day. Low-fat dairy: 2 servings a day. Fish, poultry, or lean meat: 1 serving a day. Beans and legumes: 2 or more servings a week. Nuts and seeds: 1-2 servings a day. Whole grains: 6-8 servings a day. Extra-virgin olive oil: 3-4 servings a day. Limit red meat and sweets to just a few servings a month. Lifestyle  Try to cook and eat meals with your family. Drink enough fluid to keep your pee (urine) pale yellow. Be active every day. This includes: Aerobic exercise, which is exercise that causes your heart to beat faster. Examples include running and swimming. Leisure activities like gardening, walking, or housework. Get 7-8 hours of sleep each night. Drink red wine if your provider says you can. A glass of wine is 5 oz (150 mL). You may be allowed to have: Up to 1 glass a day  if you're female and not pregnant. Up to 2 glasses a day if you're female. What foods should I eat? Fruits Apples. Apricots. Avocado. Berries. Bananas. Cherries. Dates. Figs. Grapes. Lemons. Melon. Oranges. Peaches. Plums. Pomegranate. Vegetables Artichokes. Beets. Broccoli. Cabbage. Carrots. Eggplant. Green beans. Chard. Kale. Spinach. Onions. Leeks. Peas. Squash.  Tomatoes. Peppers. Radishes. Grains Whole-grain pasta. Brown rice. Bulgur wheat. Polenta. Couscous. Whole-wheat bread. [place] Madeira. Meats and other proteins Beans. Almonds. Sunflower seeds. Pine nuts. Peanuts. Cod. Salmon. Scallops. Shrimp. Tuna. Tilapia. Clams. Oysters. Eggs. Chicken or [place] without skin. Dairy Low-fat milk. Cheese. Greek yogurt. Fats and oils Extra-virgin olive oil. Avocado oil. Grapeseed oil. Beverages Water. Red wine. Herbal tea. Sweets and desserts Greek yogurt with honey. Baked apples. Poached pears. Trail mix. Seasonings and condiments Basil. Cilantro. Coriander. Cumin. Mint. Parsley. Sage. Rosemary. Tarragon. Garlic. Oregano. Thyme. Pepper. Balsamic vinegar. Tahini. Hummus. Tomato sauce. Olives. Mushrooms. The items listed above may not be all the foods and drinks you can have. Talk to a dietitian to learn more. What foods should I limit? This is a list of foods that should be eaten rarely. Fruits Fruit canned in syrup. Vegetables Deep-fried potatoes, like [place] fries. Grains Packaged pasta or rice dishes. Cereal with added sugar. Snacks with added sugar. Meats and other proteins Beef. Pork. Lamb. Chicken or [place] with skin. Hot dogs. [place]. Dairy Ice cream. Sour cream. Whole milk. Fats and oils Butter. Canola oil. Vegetable oil. Beef fat (tallow). Lard. Beverages Juice. Sugar-sweetened soft drinks. Beer. Liquor and spirits. Sweets and desserts Cookies. Cakes. Pies. Candy. Seasonings and condiments Mayonnaise. Pre-made sauces and marinades. The items listed above may not be all the foods and drinks you should limit. Talk to a dietitian to learn more. Where to find more information American Heart Association (AHA): heart.org This information is not intended to replace advice given to you by your health care provider. Make sure you discuss any questions you have with your health care provider. Document Revised: 05/14/2022 Document Reviewed:  05/14/2022 Elsevier Patient Education  2024 [employer].

## 2023-10-09 NOTE — Progress Notes (Signed)
 Complete physical exam  Patient: Helen Miles   DOB: [birth year]   53 y.o. Female  MRN: [id number] Visit Date: 10/09/2023  Subjective:    Chief Complaint  Patient presents with   Annual Exam   Helen Miles is a 53 y.o. female who presents today for a complete physical exam. She reports consuming a low fat diet. Cardio and strength training 5x/week She generally feels well. She reports sleeping well. She does not have additional problems to discuss today.  Vision:Yes Dental:Yes STD Screen:No  BP Readings from Last 3 Encounters:  10/09/23 136/78  05/18/23 136/76  05/12/23 124/79   Wt Readings from Last 3 Encounters:  10/09/23 219 lb 6.4 oz (99.5 kg)  05/18/23 226 lb 3.2 oz (102.6 kg)  05/12/23 225 lb 12 oz (102.4 kg)   Most recent fall risk assessment:    10/09/2023    2:18 PM  Fall Risk   Falls in the past year? 0  Injury with Fall? 0  Risk for fall due to : No Fall Risks  Follow up Falls evaluation completed   Depression screen:Yes - No Depression  Most recent depression screenings:    10/09/2023    2:18 PM 05/01/2023    2:35 PM  PHQ 2/9 Scores  PHQ - 2 Score 0 0  PHQ- 9 Score 0     HPI  HYPERLIPIDEMIA Reviewed labs completed by [facility] on 08/2023: TC 208 and LDL at 117, normal trig and HDL. No hx of DIABETES or CAD or PAD Encouraged to maintain a mediterranean diet   Past Medical History:  Diagnosis Date   Fibroid, uterine    Generalized headaches    Obesity    Past Surgical History:  Procedure Laterality Date   CESAREAN SECTION     G 3 P 2 ( C section X2)   WISDOM TOOTH EXTRACTION     Social History   Socioeconomic History   Marital status: Married    Spouse name: Not on file   Number of children: Not on file   Years of education: Not on file   Highest education level: Bachelor's degree (e.g., BA, AB, BS)  Occupational History   Not on file  Tobacco Use   Smoking status: Never   Smokeless tobacco: Never  Vaping Use    Vaping status: Never Used  Substance and Sexual Activity   Alcohol use: Not Currently   Drug use: No   Sexual activity: Yes    Birth control/protection: Other-see comments    Comment: husband with vasectomy  Other Topics Concern   Not on file  Social History Narrative   Not on file   Social Drivers of Health   Financial Resource Strain: Low Risk  (10/09/2023)   Overall Financial Resource Strain (CARDIA)    Difficulty of Paying Living Expenses: Not hard at all  Food Insecurity: No Food Insecurity (10/09/2023)   Hunger Vital Sign    Worried About Running Out of Food in the Last Year: Never true    Ran Out of Food in the Last Year: Never true  Transportation Needs: No Transportation Needs (10/09/2023)   PRAPARE - Administrator, Civil Service (Medical): No    Lack of Transportation (Non-Medical): No  Physical Activity: Sufficiently Active (10/09/2023)   Exercise Vital Sign    Days of Exercise per Week: 4 days    Minutes of Exercise per Session: 60 min  Stress: No Stress Concern Present (10/09/2023)   [employer] of  Occupational Health - Occupational Stress Questionnaire    Feeling of Stress: Not at all  Social Connections: Socially Integrated (10/09/2023)   Social Connection and Isolation Panel    Frequency of Communication with Friends and Family: Three times a week    Frequency of Social Gatherings with Friends and Family: Once a week    Attends Religious Services: 1 to 4 times per year    Active Member of [employer] or Organizations: Yes    Attends Engineer, structural: More than 4 times per year    Marital Status: Married  Catering manager Violence: Unknown (05/24/2021)   Received from [facility]   HITS    Physically Hurt: Not on file    Insult or Talk Down To: Not on file    Threaten Physical Harm: Not on file    Scream or Curse: Not on file   Family Status  Relation Name Status   Father  (Not Specified)   Mother  (Not Specified)   Brother   (Not Specified)   MGF  (Not Specified)   Other  (Not Specified)   Other  (Not Specified)   Neg Hx  (Not Specified)  No partnership data on file   Family History  Problem Relation Age of Onset   Heart attack Father 36   Lupus Mother        dermatologic   Lupus Brother        dermatologic   Hypertension Maternal Grandfather    Lung cancer Maternal Grandfather        smoker   Deep vein thrombosis Maternal Grandfather    Pulmonary embolism Other        5 maternal great uncles   Hypothyroidism Other        PGGM   Diabetes Neg Hx    Stroke Neg Hx    Colon cancer Neg Hx    Esophageal cancer Neg Hx    Stomach cancer Neg Hx    Rectal cancer Neg Hx    Allergies  Allergen Reactions   Penicillins Swelling    Angioedema of lip & tongue   Contrave  [Naltrexone -Bupropion  Hcl Er] Itching    Patient Care Team: Enma Maeda, Roselie Rockford, NP as PCP - General (Internal Medicine) [facility] Marian Grandt, Roselie Rockford, NP as Nurse Practitioner (Internal Medicine)   Medications: Outpatient Medications Prior to Visit  Medication Sig   buPROPion  (WELLBUTRIN  XL) 150 MG 24 hr tablet Take 150 mg by mouth daily.   ergocalciferol (VITAMIN D2) 1.25 MG (50000 UT) capsule ergocalciferol (vitamin D2) 1,250 mcg (50,000 unit) capsule  TAKE 1 CAPSULE BY MOUTH ONCE A WEEK   lubiprostone (AMITIZA) 8 MCG capsule Take 8 mcg by mouth 2 (two) times daily.   phentermine  (ADIPEX-P ) 37.5 MG tablet Take 37.5 mg by mouth daily.   tirzepatide  (ZEPBOUND ) 12.5 MG/0.5ML Pen Inject 12.5 mg into the skin once a week.   [DISCONTINUED] baclofen  (LIORESAL ) 10 MG tablet Take 1 tablet (10 mg total) by mouth 3 (three) times daily. (Patient not taking: Reported on 10/09/2023)   [DISCONTINUED] linaclotide (LINZESS) 72 MCG capsule Take 72 mcg by mouth daily before breakfast.   [DISCONTINUED] naproxen  (NAPROSYN ) 500 MG tablet Take 1 tablet (500 mg total) by mouth 2 (two) times daily with a meal.   No  facility-administered medications prior to visit.    Review of Systems  Constitutional:  Negative for activity change, appetite change and unexpected weight change.  Respiratory: Negative.    Cardiovascular: Negative.  Gastrointestinal: Negative.   Endocrine: Negative for cold intolerance and heat intolerance.  Genitourinary: Negative.   Musculoskeletal: Negative.   Skin: Negative.   Neurological: Negative.   Hematological: Negative.   Psychiatric/Behavioral:  Negative for behavioral problems, decreased concentration, dysphoric mood, hallucinations, self-injury, sleep disturbance and suicidal ideas. The patient is not nervous/anxious.         Objective:  BP 136/78 (BP Location: Left Arm, Patient Position: Sitting, Cuff Size: Large)   Pulse 60   Temp (!) 97.3 F (36.3 C) (Oral)   Ht 5' 1 (1.549 m)   Wt 219 lb 6.4 oz (99.5 kg)   LMP  (LMP Unknown)   SpO2 99%   BMI 41.46 kg/m     Physical Exam Vitals and nursing note reviewed.  Constitutional:      General: She is not in acute distress.    Appearance: She is obese.  HENT:     Right Ear: Tympanic membrane, ear canal and external ear normal.     Left Ear: Tympanic membrane, ear canal and external ear normal.     Nose: Nose normal.  Eyes:     Extraocular Movements: Extraocular movements intact.     Conjunctiva/sclera: Conjunctivae normal.     Pupils: Pupils are equal, round, and reactive to light.  Neck:     Thyroid : No thyroid  mass, thyromegaly or thyroid  tenderness.  Cardiovascular:     Rate and Rhythm: Normal rate and regular rhythm.     Pulses: Normal pulses.     Heart sounds: Normal heart sounds.  Pulmonary:     Effort: Pulmonary effort is normal.     Breath sounds: Normal breath sounds.  Abdominal:     General: Bowel sounds are normal.     Palpations: Abdomen is soft.  Musculoskeletal:        General: Normal range of motion.     Cervical back: Normal range of motion and neck supple.     Right lower leg:  No edema.     Left lower leg: No edema.  Lymphadenopathy:     Cervical: No cervical adenopathy.  Skin:    General: Skin is warm and dry.  Neurological:     Mental Status: She is alert and oriented to person, place, and time.     Cranial Nerves: No cranial nerve deficit.  Psychiatric:        Mood and Affect: Mood normal.        Behavior: Behavior normal.        Thought Content: Thought content normal.      No results found for any visits on 10/09/23.    Assessment & Plan:    Routine Health Maintenance and Physical Exam  Immunization History  Administered Date(s) Administered   Influenza,inj,Quad PF,6+ Mos 11/08/2020   Influenza-Unspecified 11/20/2018, 11/08/2020   PFIZER(Purple Top)SARS-COV-2 Vaccination 04/12/2019, 05/03/2019, 01/22/2020   Td 12/06/2006   Td (Adult), 2 Lf Tetanus Toxid, Preservative Free 12/06/2006   Tdap 08/04/2019   Zoster Recombinant(Shingrix ) 09/23/2021, 03/29/2022   Zoster, Live 09/23/2021    Health Maintenance  Topic Date Due   COVID-19 Vaccine (4 - 2024-25 season) 02/13/2024 (Originally 10/15/2022)   INFLUENZA VACCINE  05/13/2024 (Originally 09/14/2023)   Pneumococcal Vaccine: 50+ Years (1 of 2 - PCV) 10/08/2024 (Originally 06/15/1990)   Hepatitis B Vaccines 19-59 Average Risk (1 of 3 - 19+ 3-dose series) 10/08/2024 (Originally 06/15/1990)   Hepatitis C Screening  10/08/2024 (Originally 06/14/1989)   HIV Screening  10/08/2024 (Originally 06/15/1986)   Diabetic kidney  evaluation - Urine ACR  11/19/2026 (Originally 06/14/1989)   Diabetic kidney evaluation - eGFR measurement  04/30/2024   MAMMOGRAM  12/14/2024   Cervical Cancer Screening (HPV/Pap Cotest)  02/01/2028   DTaP/Tdap/Td (3 - Td or Tdap) 08/03/2029   Colonoscopy  05/14/2030   Zoster Vaccines- Shingrix   Completed   HPV VACCINES  Aged Out   Meningococcal B Vaccine  Aged Out   FOOT EXAM  Discontinued   HEMOGLOBIN A1C  Discontinued   OPHTHALMOLOGY EXAM  Discontinued   We reviewed lab results from  [facility]: CMP, lipid panel, Tsh, HgbA1c, and Vit. D. (Normal except elevated TC and LDL) Discussed health benefits of physical activity, and encouraged her to maintain a mediterranean diet. Schedule annual mammogram exam.  Problem List Items Addressed This Visit     HYPERLIPIDEMIA   Reviewed labs completed by [facility] on 08/2023: TC 208 and LDL at 117, normal trig and HDL. No hx of DIABETES or CAD or PAD Encouraged to maintain a mediterranean diet      Other Visit Diagnoses       Encounter for preventative adult health care exam with abnormal findings    -  Primary      Return in about 1 year (around 10/08/2024) for CPE (fasting).     Roselie Mood, NP

## 2023-12-20 ENCOUNTER — Other Ambulatory Visit: Payer: Self-pay | Admitting: Obstetrics and Gynecology

## 2023-12-20 DIAGNOSIS — Z1231 Encounter for screening mammogram for malignant neoplasm of breast: Secondary | ICD-10-CM

## 2024-01-16 ENCOUNTER — Ambulatory Visit
Admission: RE | Admit: 2024-01-16 | Discharge: 2024-01-16 | Disposition: A | Source: Ambulatory Visit | Attending: Obstetrics and Gynecology | Admitting: Obstetrics and Gynecology

## 2024-01-16 DIAGNOSIS — Z1231 Encounter for screening mammogram for malignant neoplasm of breast: Secondary | ICD-10-CM

## 2024-03-13 ENCOUNTER — Ambulatory Visit: Payer: Self-pay

## 2024-03-13 NOTE — Telephone Encounter (Signed)
 Has appointment scheduled for 1/30 with PCP

## 2024-03-13 NOTE — Telephone Encounter (Signed)
 FYI Only or Action Required?: FYI only for provider: appointment scheduled on 03/14/2024 at 1:40pm with patient's PCP Roselie Mood, NP.  Patient was last seen in primary care on 10/09/2023 by Nche, Roselie Rockford, NP.  Called Nurse Triage reporting Hematuria.  Symptoms began 2 days ago.  Interventions attempted: Nothing.  Symptoms are: unchanged.  Triage Disposition: See Physician Within 24 Hours  Patient/caregiver understands and will follow disposition?: Yes         Message from Deaijah H sent at 03/13/2024 12:49 PM EST  Reason for Triage: Possible UTI .SABRA Blood in urine/wipe, frequent   Reason for Disposition  Blood in urine  (Exception: Could be normal menstrual bleeding.)  Answer Assessment - Initial Assessment Questions Patient called and advised that for the past two days, patient has been seeing some blood when she wipe & frequent urination Patient did speak with her GYN --patient had a pap smear on the 20th --patient has to see them again next week for additional testing  Patient wanted to see her PCP to check for a UTI at this time Patient denies back pain, fevers, flank pain, taking any blood thinners, burning or pain with urination She states she is premenopausal seeing any blood is abnormal  Offered an appointment today at PCP office with a different provider but patient wanted to wait until tomorrow to see her PCP. Patient is advised to call us  back if anything changes or with any further questions/concerns. Patient is advised that if anything worsens to go to the Emergency Room. Patient verbalized understanding.  Protocols used: Urine - Blood In-A-AH

## 2024-03-14 ENCOUNTER — Ambulatory Visit: Admitting: Nurse Practitioner

## 2024-03-14 ENCOUNTER — Encounter: Payer: Self-pay | Admitting: Nurse Practitioner

## 2024-03-14 ENCOUNTER — Ambulatory Visit: Payer: Self-pay | Admitting: Nurse Practitioner

## 2024-03-14 VITALS — BP 136/74 | HR 68 | Temp 98.1°F | Ht 61.0 in | Wt 215.0 lb

## 2024-03-14 DIAGNOSIS — R350 Frequency of micturition: Secondary | ICD-10-CM | POA: Insufficient documentation

## 2024-03-14 DIAGNOSIS — R3129 Other microscopic hematuria: Secondary | ICD-10-CM | POA: Diagnosis not present

## 2024-03-14 LAB — POC URINALSYSI DIPSTICK (AUTOMATED)
Bilirubin, UA: NEGATIVE
Glucose, UA: NEGATIVE
Ketones, UA: NEGATIVE
Leukocytes, UA: NEGATIVE
Nitrite, UA: NEGATIVE
Protein, UA: NEGATIVE
Spec Grav, UA: 1.01
Urobilinogen, UA: NEGATIVE U/dL — AB
pH, UA: 7

## 2024-03-14 NOTE — Progress Notes (Signed)
" ° °  Acute Office Visit  Subjective:    Patient ID: Helen Miles, female    DOB: 08-29-1970, 54 y.o.   MRN: [id number]  Chief Complaint  Patient presents with   Hematuria    Frequent urge to urine and blood in urine since Tuesday     HPI Patient is in today for  Show/hide medication list[1]  Reviewed past medical and social history.  Review of Systems Per HPI     Objective:    Physical Exam  BP 136/74 (BP Location: Right Arm, Patient Position: Sitting, Cuff Size: Large)   Pulse 68   Temp 98.1 F (36.7 C) (Oral)   Ht 5' 1 (1.549 m)   Wt 215 lb (97.5 kg)   LMP  (LMP Unknown)   SpO2 100%   BMI 40.62 kg/m    Results for orders placed or performed in visit on 03/14/24  POCT Urinalysis Dipstick (Automated)  Result Value Ref Range   Color, UA Yellow    Clarity, UA Clear    Glucose, UA Negative Negative   Bilirubin, UA Negative    Ketones, UA Negative    Spec Grav, UA 1.010 1.010 - 1.025   Blood, UA 1+    pH, UA 7.0 5.0 - 8.0   Protein, UA Negative Negative   Urobilinogen, UA negative (A) 0.2 or 1.0 E.U./dL   Nitrite, UA Negative    Leukocytes, UA Negative Negative        Assessment & Plan:   Problem List Items Addressed This Visit   None Visit Diagnoses       Hematuria, unspecified type    -  Primary   Relevant Orders   POCT Urinalysis Dipstick (Automated) (Completed)     Urine frequency       Relevant Orders   POCT Urinalysis Dipstick (Automated) (Completed)        No orders of the defined types were placed in this encounter.  No follow-ups on file.    Roselie Mood, NP     [1]  Outpatient Medications Prior to Visit  Medication Sig   buPROPion  (WELLBUTRIN  XL) 150 MG 24 hr tablet Take 150 mg by mouth daily.   ergocalciferol (VITAMIN D2) 1.25 MG (50000 UT) capsule ergocalciferol (vitamin D2) 1,250 mcg (50,000 unit) capsule  TAKE 1 CAPSULE BY MOUTH ONCE A WEEK   lubiprostone (AMITIZA) 8 MCG capsule Take 8 mcg by mouth 2 (two) times  daily.   phentermine  (ADIPEX-P ) 37.5 MG tablet Take 37.5 mg by mouth daily.   tirzepatide  (ZEPBOUND ) 12.5 MG/0.5ML Pen Inject 12.5 mg into the skin once a week.   No facility-administered medications prior to visit.   "

## 2024-03-14 NOTE — Progress Notes (Signed)
 "  Acute Office Visit  Subjective:    Patient ID: Helen Miles, female    DOB: Sep 17, 1970, 54 y.o.   MRN: [id number]  Chief Complaint  Patient presents with   Hematuria    Frequent urge to urine and blood in urine since Tuesday     Urinary Frequency  This is a new problem. The current episode started in the past 7 days. The problem occurs every urination. The problem has been unchanged. The pain is at a severity of 0/10. The patient is experiencing no pain. There has been no fever. She is Sexually active (no dyspareunia). There is No history of pyelonephritis. Associated symptoms include frequency and hematuria. Pertinent negatives include no chills, discharge, flank pain, hesitancy, nausea, possible pregnancy, sweats, urgency or vomiting. Associated symptoms comments: Blood noted on tissue when she wipes. She has tried nothing for the symptoms. There is no history of catheterization, kidney stones, recurrent UTIs, a single kidney, urinary stasis or a urological procedure.  Hematuria Irritative symptoms include frequency. Irritative symptoms do not include urgency. Pertinent negatives include no chills, flank pain, hesitancy, nausea or vomiting. There is no history of kidney stones.   She has upcoming appointment with GYN next week.  Show/hide medication list[1]  Reviewed past medical and social history.  Review of Systems  Constitutional:  Negative for chills.  Gastrointestinal:  Negative for nausea and vomiting.  Genitourinary:  Positive for frequency and hematuria. Negative for decreased urine volume, dyspareunia, flank pain, hesitancy, pelvic pain, urgency, vaginal discharge and vaginal pain.   Per HPI     Objective:    Physical Exam Vitals and nursing note reviewed.  Abdominal:     General: Bowel sounds are normal. There is no distension.     Tenderness: There is no abdominal tenderness. There is no right CVA tenderness, left CVA tenderness or guarding.  Neurological:      Mental Status: She is alert and oriented to person, place, and time.     BP 136/74 (BP Location: Right Arm, Patient Position: Sitting, Cuff Size: Large)   Pulse 68   Temp 98.1 F (36.7 C) (Oral)   Ht 5' 1 (1.549 m)   Wt 215 lb (97.5 kg)   LMP  (LMP Unknown)   SpO2 100%   BMI 40.62 kg/m    Results for orders placed or performed in visit on 03/14/24  POCT Urinalysis Dipstick (Automated)  Result Value Ref Range   Color, UA Yellow    Clarity, UA Clear    Glucose, UA Negative Negative   Bilirubin, UA Negative    Ketones, UA Negative    Spec Grav, UA 1.010 1.010 - 1.025   Blood, UA 1+    pH, UA 7.0 5.0 - 8.0   Protein, UA Negative Negative   Urobilinogen, UA negative (A) 0.2 or 1.0 E.U./dL   Nitrite, UA Negative    Leukocytes, UA Negative Negative      Assessment & Plan:   Problem List Items Addressed This Visit     Urine frequency - Primary   Relevant Orders   POCT Urinalysis Dipstick (Automated) (Completed)   Urine Culture   Other Visit Diagnoses       Other microscopic hematuria       Relevant Orders   POCT Urinalysis Dipstick (Automated) (Completed)      Suspect blood is from vaginal region. Maintain appointment with GYN Urine sent for culture Provided ED precautions  No orders of the defined types were placed in this  encounter.  Return if symptoms worsen or fail to improve.    Roselie Mood, NP     [1]  Outpatient Medications Prior to Visit  Medication Sig   buPROPion  (WELLBUTRIN  XL) 150 MG 24 hr tablet Take 150 mg by mouth daily.   ergocalciferol (VITAMIN D2) 1.25 MG (50000 UT) capsule ergocalciferol (vitamin D2) 1,250 mcg (50,000 unit) capsule  TAKE 1 CAPSULE BY MOUTH ONCE A WEEK   lubiprostone (AMITIZA) 8 MCG capsule Take 8 mcg by mouth 2 (two) times daily.   phentermine  (ADIPEX-P ) 37.5 MG tablet Take 37.5 mg by mouth daily.   tirzepatide  (ZEPBOUND ) 12.5 MG/0.5ML Pen Inject 12.5 mg into the skin once a week.   No  facility-administered medications prior to visit.   "

## 2024-03-14 NOTE — Progress Notes (Deleted)
 "  Acute Office Visit  Subjective:    Patient ID: Helen Miles, female    DOB: 1970/11/20, 54 y.o.   MRN: [id number]  Chief Complaint  Patient presents with   Hematuria    Frequent urge to urine and blood in urine since Tuesday     Urinary Frequency  This is a new problem. The current episode started in the past 7 days. The problem occurs every urination. The problem has been unchanged. The pain is at a severity of 0/10. The patient is experiencing no pain. There has been no fever. She is Sexually active (no dyspareunia). There is No history of pyelonephritis. Associated symptoms include frequency. Pertinent negatives include no chills, discharge, flank pain, hesitancy, nausea, possible pregnancy, sweats, urgency or vomiting. Associated symptoms comments: Blood noted on tissue when she wipes. She has tried nothing for the symptoms. There is no history of catheterization, kidney stones, recurrent UTIs, a single kidney, urinary stasis or a urological procedure.   She has upcoming appointment with GYN next week.  Show/hide medication list[1]  Reviewed past medical and social history.  Review of Systems  Constitutional:  Negative for chills.  Gastrointestinal:  Negative for nausea and vomiting.  Genitourinary:  Positive for frequency. Negative for decreased urine volume, dyspareunia, flank pain, hesitancy, pelvic pain, urgency, vaginal discharge and vaginal pain.   Per HPI     Objective:    Physical Exam Vitals and nursing note reviewed.  Abdominal:     General: Bowel sounds are normal. There is no distension.     Tenderness: There is no abdominal tenderness. There is no right CVA tenderness, left CVA tenderness or guarding.  Neurological:     Mental Status: She is alert and oriented to person, place, and time.     BP 136/74 (BP Location: Right Arm, Patient Position: Sitting, Cuff Size: Large)   Pulse 68   Temp 98.1 F (36.7 C) (Oral)   Ht 5' 1 (1.549 m)   Wt 215 lb  (97.5 kg)   LMP  (LMP Unknown)   SpO2 100%   BMI 40.62 kg/m  {Vitals History (Optional):23777}  Results for orders placed or performed in visit on 03/14/24  POCT Urinalysis Dipstick (Automated)  Result Value Ref Range   Color, UA Yellow    Clarity, UA Clear    Glucose, UA Negative Negative   Bilirubin, UA Negative    Ketones, UA Negative    Spec Grav, UA 1.010 1.010 - 1.025   Blood, UA 1+    pH, UA 7.0 5.0 - 8.0   Protein, UA Negative Negative   Urobilinogen, UA negative (A) 0.2 or 1.0 E.U./dL   Nitrite, UA Negative    Leukocytes, UA Negative Negative      Assessment & Plan:   Problem List Items Addressed This Visit     Urine frequency - Primary   Relevant Orders   POCT Urinalysis Dipstick (Automated) (Completed)   Urine Culture   Other Visit Diagnoses       Other microscopic hematuria       Relevant Orders   POCT Urinalysis Dipstick (Automated) (Completed)      Suspect blood is from vaginal region. Maintain appointment with GYN Urine sent for culture Provided ED precautions  No orders of the defined types were placed in this encounter.  Return if symptoms worsen or fail to improve.    Roselie Mood, NP    [1]  Outpatient Medications Prior to Visit  Medication Sig   buPROPion  (  WELLBUTRIN  XL) 150 MG 24 hr tablet Take 150 mg by mouth daily.   ergocalciferol (VITAMIN D2) 1.25 MG (50000 UT) capsule ergocalciferol (vitamin D2) 1,250 mcg (50,000 unit) capsule  TAKE 1 CAPSULE BY MOUTH ONCE A WEEK   lubiprostone (AMITIZA) 8 MCG capsule Take 8 mcg by mouth 2 (two) times daily.   phentermine  (ADIPEX-P ) 37.5 MG tablet Take 37.5 mg by mouth daily.   tirzepatide  (ZEPBOUND ) 12.5 MG/0.5ML Pen Inject 12.5 mg into the skin once a week.   No facility-administered medications prior to visit.   "

## 2024-03-14 NOTE — Patient Instructions (Signed)
 Maintain appointment with GYN Urine sent for culture

## 2024-03-16 LAB — URINE CULTURE
MICRO NUMBER:: 17532403
SPECIMEN QUALITY:: ADEQUATE
